# Patient Record
Sex: Female | Born: 2000 | Race: White | Hispanic: No | Marital: Single | Smoking: Never smoker
Health system: Southern US, Community
[De-identification: ages and names within clinical notes are randomized; demographics above are authoritative.]

## PROBLEM LIST (undated history)

## (undated) ENCOUNTER — Ambulatory Visit: Payer: PRIVATE HEALTH INSURANCE

## (undated) ENCOUNTER — Telehealth

## (undated) ENCOUNTER — Encounter: Attending: Nurse Practitioner | Primary: Nurse Practitioner

## (undated) ENCOUNTER — Telehealth: Attending: Pediatrics | Primary: Pediatrics

## (undated) ENCOUNTER — Ambulatory Visit: Payer: Medicaid (Managed Care)

## (undated) ENCOUNTER — Encounter

## (undated) ENCOUNTER — Telehealth
Attending: Student in an Organized Health Care Education/Training Program | Primary: Student in an Organized Health Care Education/Training Program

## (undated) ENCOUNTER — Ambulatory Visit

## (undated) ENCOUNTER — Non-Acute Institutional Stay: Payer: PRIVATE HEALTH INSURANCE

## (undated) ENCOUNTER — Ambulatory Visit
Payer: Medicaid (Managed Care) | Attending: Student in an Organized Health Care Education/Training Program | Primary: Student in an Organized Health Care Education/Training Program

## (undated) ENCOUNTER — Encounter
Attending: Student in an Organized Health Care Education/Training Program | Primary: Student in an Organized Health Care Education/Training Program

## (undated) ENCOUNTER — Encounter: Attending: Gastroenterology | Primary: Gastroenterology

## (undated) ENCOUNTER — Other Ambulatory Visit

## (undated) ENCOUNTER — Ambulatory Visit: Payer: PRIVATE HEALTH INSURANCE | Attending: Otolaryngology | Primary: Otolaryngology

## (undated) ENCOUNTER — Telehealth: Attending: Pediatric Gastroenterology | Primary: Pediatric Gastroenterology

## (undated) ENCOUNTER — Encounter: Payer: PRIVATE HEALTH INSURANCE | Attending: Pediatrics | Primary: Pediatrics

## (undated) ENCOUNTER — Encounter: Attending: Pediatric Gastroenterology | Primary: Pediatric Gastroenterology

## (undated) ENCOUNTER — Encounter: Attending: Rheumatology | Primary: Rheumatology

## (undated) ENCOUNTER — Inpatient Hospital Stay: Payer: Medicaid (Managed Care)

## (undated) ENCOUNTER — Inpatient Hospital Stay

## (undated) ENCOUNTER — Ambulatory Visit: Payer: PRIVATE HEALTH INSURANCE | Attending: Nurse Practitioner | Primary: Nurse Practitioner

## (undated) ENCOUNTER — Ambulatory Visit: Payer: PRIVATE HEALTH INSURANCE | Attending: Internal Medicine | Primary: Internal Medicine

## (undated) ENCOUNTER — Encounter: Attending: Pediatrics | Primary: Pediatrics

## (undated) ENCOUNTER — Telehealth: Attending: Registered" | Primary: Registered"

## (undated) ENCOUNTER — Ambulatory Visit: Payer: Medicaid (Managed Care) | Attending: Social Worker | Primary: Social Worker

## (undated) ENCOUNTER — Ambulatory Visit: Payer: Medicaid (Managed Care) | Attending: Nurse Practitioner | Primary: Nurse Practitioner

## (undated) ENCOUNTER — Ambulatory Visit: Attending: Pediatrics | Primary: Pediatrics

## (undated) ENCOUNTER — Encounter: Payer: PRIVATE HEALTH INSURANCE | Attending: Clinical | Primary: Clinical

## (undated) ENCOUNTER — Other Ambulatory Visit: Attending: Social Worker | Primary: Social Worker

## (undated) DIAGNOSIS — K5010 Crohn's disease of large intestine without complications: Secondary | ICD-10-CM

## (undated) DIAGNOSIS — B2790 Infectious mononucleosis, unspecified without complication: Secondary | ICD-10-CM

## (undated) MED ORDER — INFLIXIMAB 100 MG INTRAVENOUS SOLUTION: INTRAVENOUS | 0 days

---
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
## 2021-01-06 ENCOUNTER — Emergency Department
Admit: 2021-01-06 | Discharge: 2021-01-06 | Disposition: A | Discharge status: Home / Self Care | Payer: PRIVATE HEALTH INSURANCE | Attending: Pediatrics

## 2021-01-06 ENCOUNTER — Ambulatory Visit
Admit: 2021-01-06 | Discharge: 2021-01-06 | Disposition: A | Discharge status: Home / Self Care | Payer: PRIVATE HEALTH INSURANCE | Attending: Pediatrics

## 2021-01-06 DIAGNOSIS — H109 Unspecified conjunctivitis: Principal | ICD-10-CM

## 2021-01-06 DIAGNOSIS — J029 Acute pharyngitis, unspecified: Principal | ICD-10-CM

## 2021-01-06 DIAGNOSIS — R509 Fever, unspecified: Principal | ICD-10-CM

## 2021-01-06 DIAGNOSIS — B340 Adenovirus infection, unspecified: Principal | ICD-10-CM

## 2021-01-06 MED ORDER — ONDANSETRON 4 MG DISINTEGRATING TABLET
ORAL_TABLET | Freq: Three times a day (TID) | ORAL | 0 refills | 3 days | Status: CP | PRN
Start: 2021-01-06 — End: 2021-01-09

## 2021-01-27 ENCOUNTER — Ambulatory Visit: Admit: 2021-01-27 | Discharge: 2021-01-28 | Payer: PRIVATE HEALTH INSURANCE

## 2021-01-27 DIAGNOSIS — K5080 Crohn's disease of both small and large intestine without complications: Principal | ICD-10-CM

## 2021-03-24 ENCOUNTER — Ambulatory Visit: Admit: 2021-03-24 | Discharge: 2021-03-24 | Payer: PRIVATE HEALTH INSURANCE

## 2021-03-24 DIAGNOSIS — K5080 Crohn's disease of both small and large intestine without complications: Principal | ICD-10-CM

## 2021-05-08 DIAGNOSIS — K5090 Crohn's disease, unspecified, without complications: Principal | ICD-10-CM

## 2021-05-19 ENCOUNTER — Ambulatory Visit: Admit: 2021-05-19 | Discharge: 2021-05-20 | Payer: PRIVATE HEALTH INSURANCE

## 2021-05-19 DIAGNOSIS — K5080 Crohn's disease of both small and large intestine without complications: Principal | ICD-10-CM

## 2021-07-14 ENCOUNTER — Ambulatory Visit: Admit: 2021-07-14 | Discharge: 2021-07-15 | Payer: PRIVATE HEALTH INSURANCE

## 2021-07-14 DIAGNOSIS — K5080 Crohn's disease of both small and large intestine without complications: Principal | ICD-10-CM

## 2021-09-08 ENCOUNTER — Ambulatory Visit: Admit: 2021-09-08 | Discharge: 2021-09-08 | Payer: PRIVATE HEALTH INSURANCE

## 2021-09-08 DIAGNOSIS — K5090 Crohn's disease, unspecified, without complications: Principal | ICD-10-CM

## 2021-09-08 DIAGNOSIS — K5080 Crohn's disease of both small and large intestine without complications: Principal | ICD-10-CM

## 2021-09-08 DIAGNOSIS — Z9189 Other specified personal risk factors, not elsewhere classified: Principal | ICD-10-CM

## 2021-09-08 DIAGNOSIS — Z79899 Other long term (current) drug therapy: Principal | ICD-10-CM

## 2021-09-22 IMAGING — CT CT NECK W/ CM
3 of 4 series · 13 of 33 positions shown, 16 images · IV contrast (omnipaque)
Comparison: No pertinent prior exams available for comparison.

CLINICAL DATA: Epiglottitis or tonsillitis suspected. Additional
provided: Patient reports sore throat for 2 days, pain with
swallowing and speaking. Mononucleosis in [REDACTED]. Fevers at home.

EXAM:
CT NECK WITH CONTRAST
TECHNIQUE: Multidetector CT imaging of the neck was performed using the
standard protocol following the bolus administration of intravenous
contrast.
CONTRAST:  75mL OMNIPAQUE IOHEXOL 300 MG/ML  SOLN

[Series 5: sag neck · sagittal · 0.47mm/px · 5 of 119 slices shown, 6 images]
[im 40/119  bone]
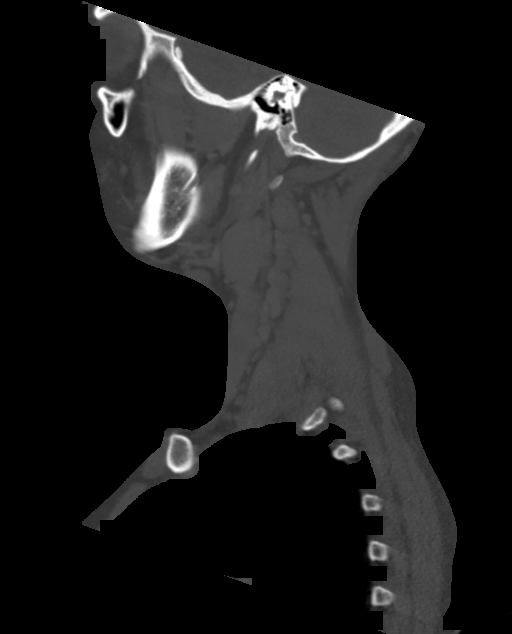
[im 50/119  bone]
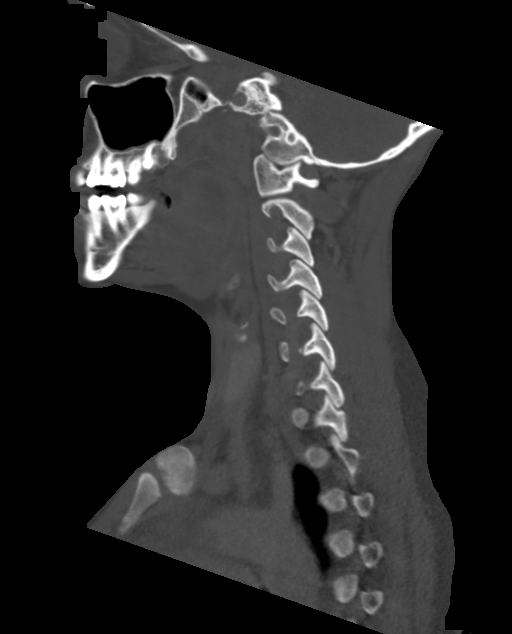
[im 60/119  soft-tissue]
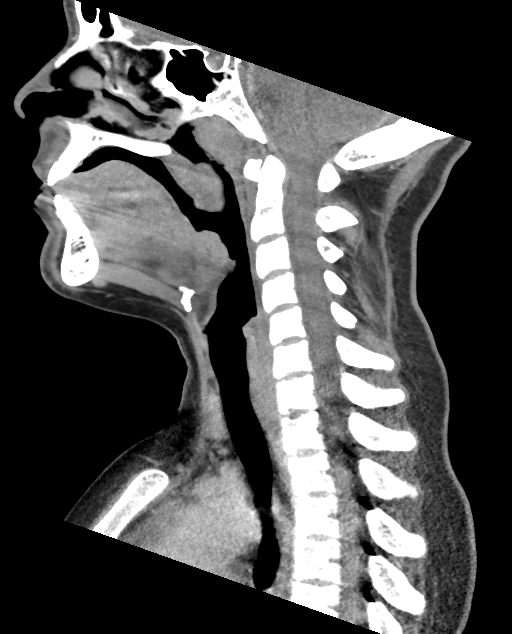
[im 60/119  bone]
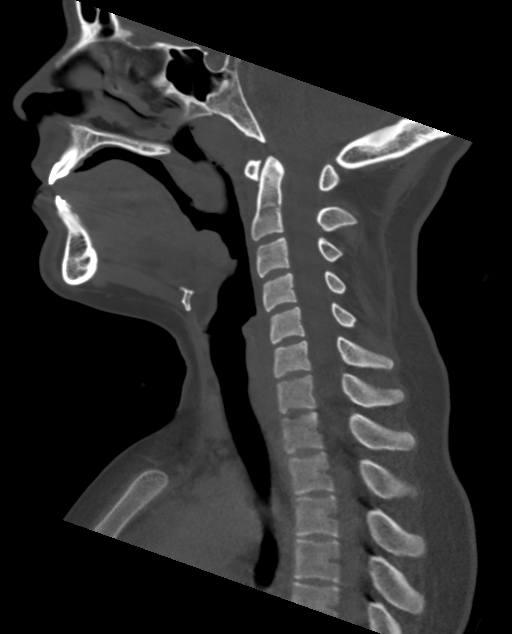
[im 69/119  bone]
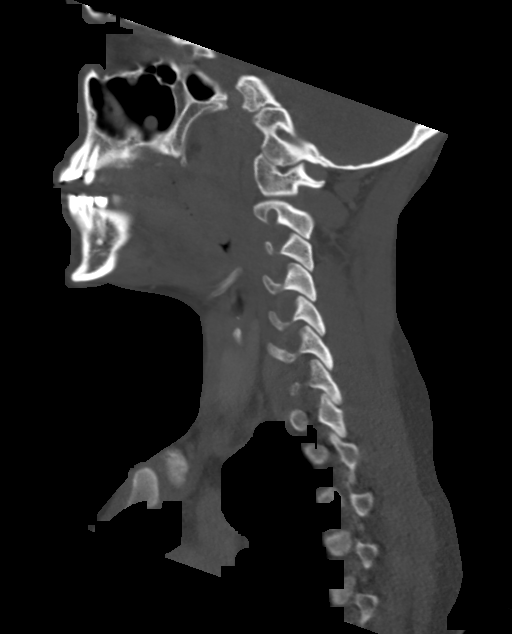
[im 79/119  bone]
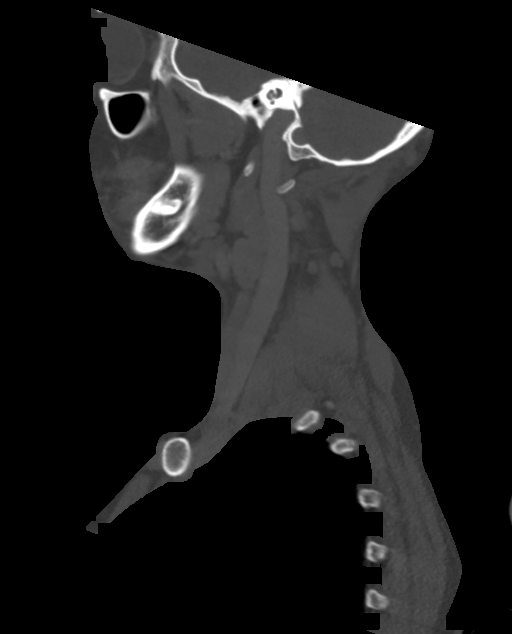

[Series 6: cor neck · coronal · 0.46mm/px · 3 of 113 slices shown]
[im 34/113  bone]
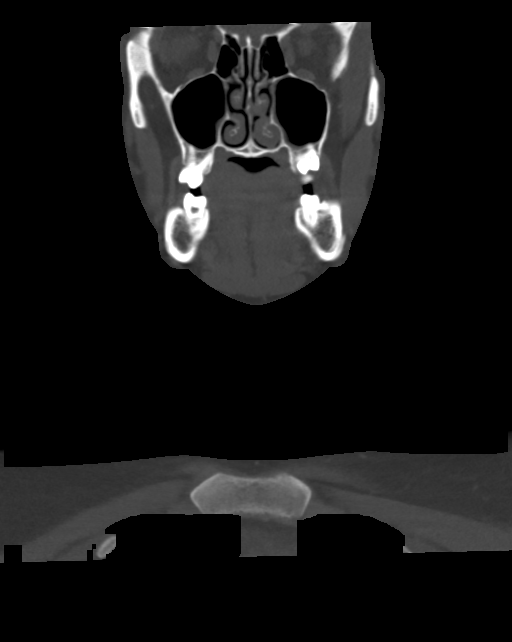
[im 49/113  bone]
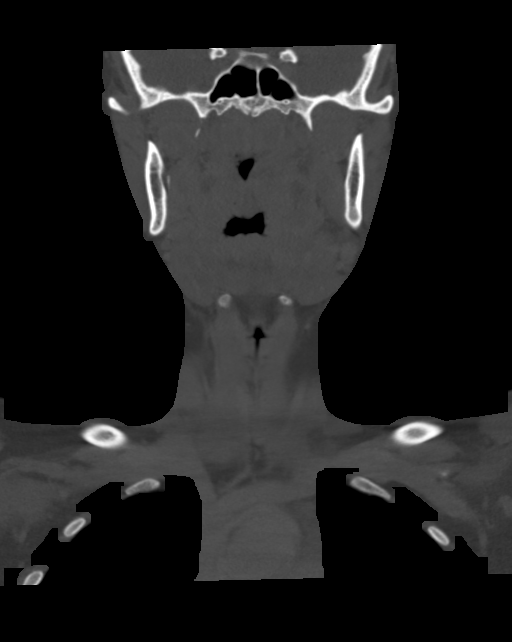
[im 64/113  bone]
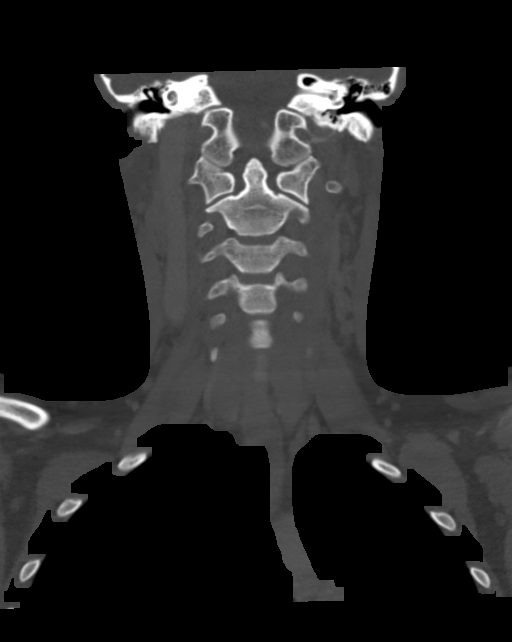

[Series 7: orthogonal ax · axial · 0.45mm/px · z∈[-305,-107]mm · 5 of 148 slices shown, 7 images]
[im 22/148  soft-tissue]
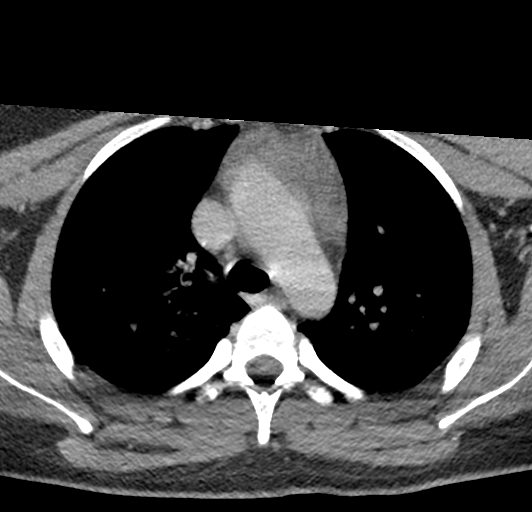
[im 22/148  bone]
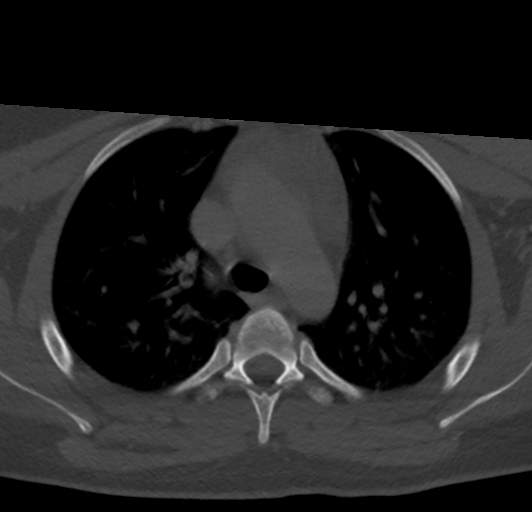
[im 43/148  bone]
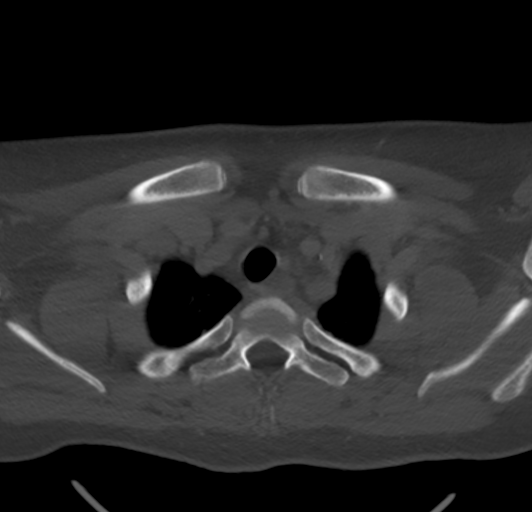
[im 85/148  bone]
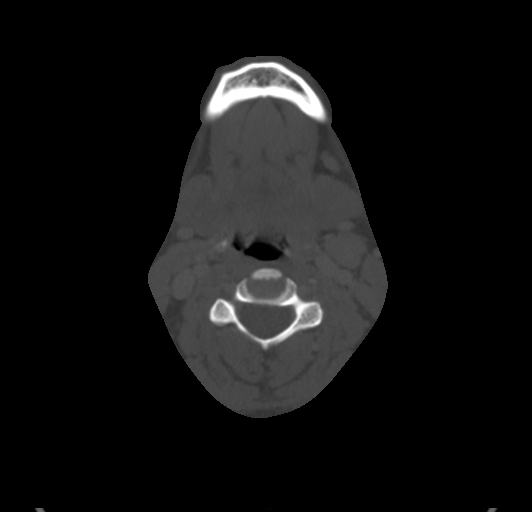
[im 106/148  bone]
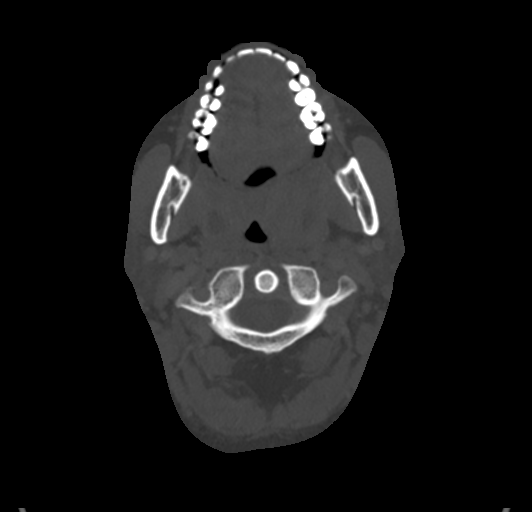
[im 127/148  soft-tissue]
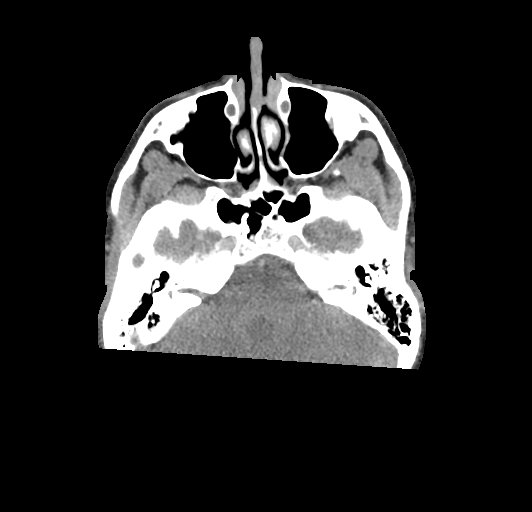
[im 127/148  bone]
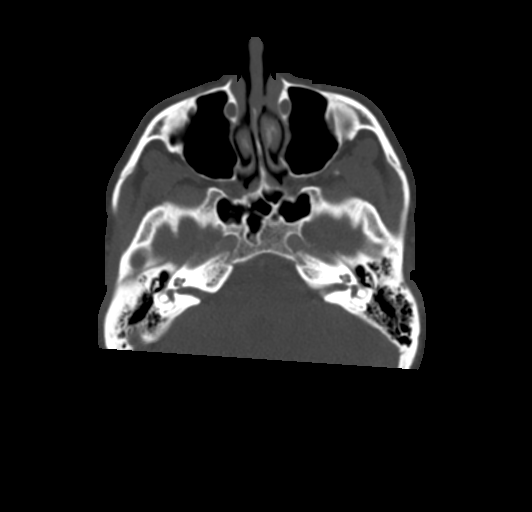

[13 of 33 positions shown; findings below may reference images not displayed]

FINDINGS: Pharynx and larynx: Marked prominence of the bilateral palatine
tonsils. To a lesser extent, there is prominence of the adenoid and
lingual tonsils. There are circumscribed low-density foci within the
bilateral palatine tonsils measuring 1.9 cm on the left and 0.9 cm
on the right likely reflecting peritonsillar abscesses. There is
mild narrowing of the oropharyngeal airway. No retropharyngeal fluid
collection. The epiglottis is unremarkable.

Salivary glands: No inflammation, mass, or stone.

Thyroid: Unremarkable.

Lymph nodes: Bilateral cervical lymphadenopathy, likely reactive
given the provided history. An index left level II lymph node
measures 2.2 cm in transverse dimension.

Vascular: The major vascular structures of the neck are patent. The
left IJ is partially effaced by left upper cervical lymphadenopathy.

Limited intracranial: No acute intracranial abnormality identified.

Visualized orbits: Incompletely imaged. No visible mass or acute
abnormality.

Mastoids and visualized paranasal sinuses: Mild ethmoid and
maxillary sinus mucosal thickening. Small right maxillary sinus
mucous retention cyst.

Skeleton: No acute bony abnormality or aggressive osseous lesion.

Upper chest: No consolidation within the imaged lung apices.
IMPRESSION: Findings compatible with tonsillitis. Probable bilateral
peritonsillar abscesses measuring 1.9 cm on the left and 0.9 cm on
the right. Mild narrowing of the oropharyngeal airway. No
retropharyngeal collection. The epiglottis is unremarkable.

Bilateral cervical lymphadenopathy, likely reactive. Clinical
follow-up is recommended to ensure resolution and exclude
alternative etiologies.

Mild ethmoid and maxillary sinus mucosal thickening. Small right
maxillary sinus mucous retention cyst.

## 2021-10-08 ENCOUNTER — Encounter
Admit: 2021-10-08 | Discharge: 2021-10-08 | Payer: PRIVATE HEALTH INSURANCE | Attending: Anesthesiology | Primary: Anesthesiology

## 2021-10-08 ENCOUNTER — Ambulatory Visit: Admit: 2021-10-08 | Discharge: 2021-10-08 | Payer: PRIVATE HEALTH INSURANCE

## 2021-11-06 ENCOUNTER — Ambulatory Visit: Admit: 2021-11-06 | Discharge: 2021-11-07 | Payer: PRIVATE HEALTH INSURANCE

## 2021-11-06 DIAGNOSIS — K5080 Crohn's disease of both small and large intestine without complications: Principal | ICD-10-CM

## 2022-01-01 ENCOUNTER — Ambulatory Visit: Admit: 2022-01-01 | Discharge: 2022-01-01 | Payer: PRIVATE HEALTH INSURANCE

## 2022-01-01 DIAGNOSIS — K5080 Crohn's disease of both small and large intestine without complications: Principal | ICD-10-CM

## 2022-01-13 ENCOUNTER — Telehealth: Admit: 2022-01-13 | Discharge: 2022-01-14 | Payer: PRIVATE HEALTH INSURANCE

## 2022-01-13 DIAGNOSIS — K5090 Crohn's disease, unspecified, without complications: Principal | ICD-10-CM

## 2022-01-13 DIAGNOSIS — Z9189 Other specified personal risk factors, not elsewhere classified: Principal | ICD-10-CM

## 2022-01-13 DIAGNOSIS — Z79899 Other long term (current) drug therapy: Principal | ICD-10-CM

## 2022-02-26 MED ORDER — ERGOCALCIFEROL (VITAMIN D2) 1,250 MCG (50,000 UNIT) CAPSULE
ORAL_CAPSULE | 1 refills | 0 days | Status: CP
Start: 2022-02-26 — End: ?

## 2022-03-03 ENCOUNTER — Ambulatory Visit: Admit: 2022-03-03 | Discharge: 2022-03-03 | Payer: PRIVATE HEALTH INSURANCE

## 2022-03-03 DIAGNOSIS — K5080 Crohn's disease of both small and large intestine without complications: Principal | ICD-10-CM

## 2022-04-19 ENCOUNTER — Ambulatory Visit
Admit: 2022-04-19 | Discharge: 2022-04-23 | Disposition: A | Discharge status: Home / Self Care | Payer: PRIVATE HEALTH INSURANCE

## 2022-04-19 ENCOUNTER — Encounter
Admit: 2022-04-19 | Discharge: 2022-04-23 | Disposition: A | Discharge status: Home / Self Care | Payer: PRIVATE HEALTH INSURANCE | Attending: Emergency Medicine

## 2022-04-19 ENCOUNTER — Encounter
Admit: 2022-04-19 | Discharge: 2022-04-23 | Payer: PRIVATE HEALTH INSURANCE | Attending: Student in an Organized Health Care Education/Training Program

## 2022-04-19 DIAGNOSIS — D696 Thrombocytopenia, unspecified: Secondary | ICD-10-CM | POA: Diagnosis not present

## 2022-04-19 DIAGNOSIS — D6959 Other secondary thrombocytopenia: Secondary | ICD-10-CM | POA: Diagnosis not present

## 2022-04-19 DIAGNOSIS — E611 Iron deficiency: Secondary | ICD-10-CM | POA: Diagnosis not present

## 2022-04-19 DIAGNOSIS — K5080 Crohn's disease of both small and large intestine without complications: Secondary | ICD-10-CM | POA: Diagnosis not present

## 2022-04-19 DIAGNOSIS — K5090 Crohn's disease, unspecified, without complications: Secondary | ICD-10-CM | POA: Diagnosis not present

## 2022-04-19 DIAGNOSIS — R21 Rash and other nonspecific skin eruption: Secondary | ICD-10-CM | POA: Diagnosis not present

## 2022-04-22 DIAGNOSIS — D693 Immune thrombocytopenic purpura: Principal | ICD-10-CM

## 2022-04-22 MED ORDER — NORETHINDRONE ACETATE 5 MG TABLET
ORAL_TABLET | 11 refills | 0 days | Status: CP
Start: 2022-04-22 — End: ?

## 2022-04-23 ENCOUNTER — Ambulatory Visit: Admit: 2022-04-23 | Discharge: 2022-04-24 | Payer: PRIVATE HEALTH INSURANCE

## 2022-04-26 ENCOUNTER — Ambulatory Visit
Admit: 2022-04-26 | Discharge: 2022-04-27 | Payer: PRIVATE HEALTH INSURANCE | Attending: Nurse Practitioner | Primary: Nurse Practitioner

## 2022-04-26 DIAGNOSIS — D693 Immune thrombocytopenic purpura: Principal | ICD-10-CM

## 2022-04-26 MED ORDER — DEXAMETHASONE 4 MG TABLET
ORAL_TABLET | Freq: Every day | ORAL | 0 refills | 4 days | Status: CP
Start: 2022-04-26 — End: 2022-04-30

## 2022-04-27 ENCOUNTER — Ambulatory Visit: Admit: 2022-04-27 | Discharge: 2022-04-28 | Payer: PRIVATE HEALTH INSURANCE

## 2022-04-27 DIAGNOSIS — D693 Immune thrombocytopenic purpura: Principal | ICD-10-CM

## 2022-04-28 ENCOUNTER — Ambulatory Visit: Admit: 2022-04-28 | Discharge: 2022-04-29 | Payer: PRIVATE HEALTH INSURANCE

## 2022-04-28 DIAGNOSIS — D693 Immune thrombocytopenic purpura: Principal | ICD-10-CM

## 2022-04-28 DIAGNOSIS — K5080 Crohn's disease of both small and large intestine without complications: Principal | ICD-10-CM

## 2022-04-28 MED ORDER — OMEPRAZOLE 20 MG CAPSULE,DELAYED RELEASE
ORAL_CAPSULE | Freq: Every day | ORAL | 0 refills | 30 days | Status: CP
Start: 2022-04-28 — End: 2022-05-28

## 2022-04-29 ENCOUNTER — Telehealth: Admit: 2022-04-29 | Discharge: 2022-04-30 | Payer: PRIVATE HEALTH INSURANCE

## 2022-04-29 DIAGNOSIS — K5090 Crohn's disease, unspecified, without complications: Principal | ICD-10-CM

## 2022-04-29 DIAGNOSIS — D693 Immune thrombocytopenic purpura: Principal | ICD-10-CM

## 2022-05-04 ENCOUNTER — Ambulatory Visit: Admit: 2022-05-04 | Discharge: 2022-05-04 | Payer: PRIVATE HEALTH INSURANCE

## 2022-05-04 DIAGNOSIS — D693 Immune thrombocytopenic purpura: Principal | ICD-10-CM

## 2022-05-04 DIAGNOSIS — K5080 Crohn's disease of both small and large intestine without complications: Principal | ICD-10-CM

## 2022-05-11 ENCOUNTER — Ambulatory Visit: Admit: 2022-05-11 | Discharge: 2022-05-12 | Payer: PRIVATE HEALTH INSURANCE

## 2022-05-12 DIAGNOSIS — D693 Immune thrombocytopenic purpura: Principal | ICD-10-CM

## 2022-05-12 DIAGNOSIS — K5080 Crohn's disease of both small and large intestine without complications: Principal | ICD-10-CM

## 2022-05-18 ENCOUNTER — Ambulatory Visit: Admit: 2022-05-18 | Discharge: 2022-05-18 | Payer: PRIVATE HEALTH INSURANCE

## 2022-05-18 DIAGNOSIS — D693 Immune thrombocytopenic purpura: Principal | ICD-10-CM

## 2022-05-18 DIAGNOSIS — K5080 Crohn's disease of both small and large intestine without complications: Principal | ICD-10-CM

## 2022-05-25 DIAGNOSIS — D693 Immune thrombocytopenic purpura: Principal | ICD-10-CM

## 2022-05-26 ENCOUNTER — Ambulatory Visit: Admit: 2022-05-26 | Discharge: 2022-05-27 | Payer: PRIVATE HEALTH INSURANCE

## 2022-05-26 DIAGNOSIS — K5080 Crohn's disease of both small and large intestine without complications: Principal | ICD-10-CM

## 2022-05-28 ENCOUNTER — Ambulatory Visit: Admit: 2022-05-28 | Discharge: 2022-05-28 | Payer: PRIVATE HEALTH INSURANCE

## 2022-05-28 DIAGNOSIS — D693 Immune thrombocytopenic purpura: Principal | ICD-10-CM

## 2022-06-04 ENCOUNTER — Ambulatory Visit: Admit: 2022-06-04 | Discharge: 2022-06-05 | Payer: PRIVATE HEALTH INSURANCE

## 2022-06-08 ENCOUNTER — Ambulatory Visit: Admit: 2022-06-08 | Discharge: 2022-06-08 | Payer: PRIVATE HEALTH INSURANCE

## 2022-06-08 DIAGNOSIS — D693 Immune thrombocytopenic purpura: Principal | ICD-10-CM

## 2022-06-10 ENCOUNTER — Ambulatory Visit
Admit: 2022-06-10 | Discharge: 2022-06-11 | Payer: PRIVATE HEALTH INSURANCE | Attending: Nurse Practitioner | Primary: Nurse Practitioner

## 2022-06-10 DIAGNOSIS — R5383 Other fatigue: Principal | ICD-10-CM

## 2022-06-10 DIAGNOSIS — E611 Iron deficiency: Principal | ICD-10-CM

## 2022-06-10 DIAGNOSIS — D693 Immune thrombocytopenic purpura: Principal | ICD-10-CM

## 2022-06-15 ENCOUNTER — Ambulatory Visit: Admit: 2022-06-15 | Discharge: 2022-06-15 | Payer: PRIVATE HEALTH INSURANCE

## 2022-06-15 DIAGNOSIS — D693 Immune thrombocytopenic purpura: Principal | ICD-10-CM

## 2022-06-22 ENCOUNTER — Ambulatory Visit: Admit: 2022-06-22 | Discharge: 2022-06-23 | Payer: PRIVATE HEALTH INSURANCE

## 2022-06-22 DIAGNOSIS — D693 Immune thrombocytopenic purpura: Principal | ICD-10-CM

## 2022-06-29 ENCOUNTER — Ambulatory Visit: Admit: 2022-06-29 | Discharge: 2022-06-30 | Payer: PRIVATE HEALTH INSURANCE

## 2022-06-29 DIAGNOSIS — D693 Immune thrombocytopenic purpura: Principal | ICD-10-CM

## 2022-07-05 DIAGNOSIS — D693 Immune thrombocytopenic purpura: Principal | ICD-10-CM

## 2022-07-06 ENCOUNTER — Ambulatory Visit: Admit: 2022-07-06 | Payer: PRIVATE HEALTH INSURANCE

## 2022-07-12 ENCOUNTER — Ambulatory Visit: Admit: 2022-07-12 | Discharge: 2022-07-13 | Payer: PRIVATE HEALTH INSURANCE

## 2022-07-12 DIAGNOSIS — D693 Immune thrombocytopenic purpura: Principal | ICD-10-CM

## 2022-07-12 DIAGNOSIS — K5080 Crohn's disease of both small and large intestine without complications: Principal | ICD-10-CM

## 2022-07-19 DIAGNOSIS — D693 Immune thrombocytopenic purpura: Principal | ICD-10-CM

## 2022-07-20 ENCOUNTER — Ambulatory Visit: Admit: 2022-07-20 | Discharge: 2022-07-21 | Payer: PRIVATE HEALTH INSURANCE

## 2022-07-20 DIAGNOSIS — D693 Immune thrombocytopenic purpura: Principal | ICD-10-CM

## 2022-07-21 ENCOUNTER — Ambulatory Visit: Admit: 2022-07-21 | Discharge: 2022-07-22 | Payer: PRIVATE HEALTH INSURANCE

## 2022-07-26 DIAGNOSIS — D693 Immune thrombocytopenic purpura: Principal | ICD-10-CM

## 2022-07-27 ENCOUNTER — Ambulatory Visit: Admit: 2022-07-27 | Discharge: 2022-07-28 | Payer: PRIVATE HEALTH INSURANCE

## 2022-07-27 DIAGNOSIS — D693 Immune thrombocytopenic purpura: Principal | ICD-10-CM

## 2022-08-04 ENCOUNTER — Ambulatory Visit: Admit: 2022-08-04 | Discharge: 2022-08-04 | Payer: PRIVATE HEALTH INSURANCE

## 2022-08-04 DIAGNOSIS — D693 Immune thrombocytopenic purpura: Principal | ICD-10-CM

## 2022-08-09 ENCOUNTER — Ambulatory Visit: Admit: 2022-08-09 | Discharge: 2022-08-09 | Payer: PRIVATE HEALTH INSURANCE

## 2022-08-09 ENCOUNTER — Ambulatory Visit: Admit: 2022-08-09 | Discharge: 2022-08-10 | Payer: PRIVATE HEALTH INSURANCE

## 2022-08-09 DIAGNOSIS — D693 Immune thrombocytopenic purpura: Principal | ICD-10-CM

## 2022-08-16 ENCOUNTER — Ambulatory Visit: Admit: 2022-08-16 | Payer: PRIVATE HEALTH INSURANCE

## 2022-08-16 DIAGNOSIS — D693 Immune thrombocytopenic purpura: Principal | ICD-10-CM

## 2022-08-23 DIAGNOSIS — D693 Immune thrombocytopenic purpura: Principal | ICD-10-CM

## 2022-08-30 DIAGNOSIS — D693 Immune thrombocytopenic purpura: Principal | ICD-10-CM

## 2022-09-06 DIAGNOSIS — D693 Immune thrombocytopenic purpura: Principal | ICD-10-CM

## 2022-09-13 DIAGNOSIS — D693 Immune thrombocytopenic purpura: Principal | ICD-10-CM

## 2022-09-15 ENCOUNTER — Ambulatory Visit: Admit: 2022-09-15 | Discharge: 2022-09-16 | Payer: PRIVATE HEALTH INSURANCE

## 2022-09-15 DIAGNOSIS — D693 Immune thrombocytopenic purpura: Principal | ICD-10-CM

## 2022-09-15 DIAGNOSIS — K5080 Crohn's disease of both small and large intestine without complications: Principal | ICD-10-CM

## 2022-09-20 DIAGNOSIS — D693 Immune thrombocytopenic purpura: Principal | ICD-10-CM

## 2022-09-21 ENCOUNTER — Ambulatory Visit
Admit: 2022-09-21 | Discharge: 2022-09-22 | Payer: PRIVATE HEALTH INSURANCE | Attending: Nurse Practitioner | Primary: Nurse Practitioner

## 2022-09-21 DIAGNOSIS — D693 Immune thrombocytopenic purpura: Principal | ICD-10-CM

## 2022-09-21 DIAGNOSIS — E611 Iron deficiency: Principal | ICD-10-CM

## 2022-09-22 ENCOUNTER — Telehealth: Admit: 2022-09-22 | Discharge: 2022-09-23 | Payer: PRIVATE HEALTH INSURANCE

## 2022-09-22 DIAGNOSIS — K5090 Crohn's disease, unspecified, without complications: Principal | ICD-10-CM

## 2022-09-22 DIAGNOSIS — Z79899 Other long term (current) drug therapy: Principal | ICD-10-CM

## 2022-09-22 MED ORDER — INFLIXIMAB-DYYB 120 MG/ML SUBCUTANEOUS PEN KIT
PACK | SUBCUTANEOUS | 3 refills | 0.00000 days | Status: CP
Start: 2022-09-22 — End: 2022-09-22
  Filled 2022-10-27: qty 3, 84d supply, fill #0

## 2022-09-23 DIAGNOSIS — K5090 Crohn's disease, unspecified, without complications: Principal | ICD-10-CM

## 2022-09-27 DIAGNOSIS — D693 Immune thrombocytopenic purpura: Principal | ICD-10-CM

## 2022-10-04 DIAGNOSIS — D693 Immune thrombocytopenic purpura: Principal | ICD-10-CM

## 2022-10-04 NOTE — Unmapped (Signed)
Zymfentra subcutaneous approval:     Zymfentra Pen approved for $4 copay for 84ds.   Patient recently completed every 8 week IV infusion on 09/15/2022 and will be due for first subcutaneous pen in place of next scheduled infusion on 11/10/2022.   Patient outreach scheduled for 10/27/2022, which is 2 weeks before first subcutaneous dose is due.  Sent patient Mychart message with link to video and communicated outreach.   IV infusion: 09/15/2022  1st subcutaneous dose due: 11/10/2022    Mirian Capuchin PharmD  [facility]   [phone] opt 4, then opt 2

## 2022-10-04 NOTE — Unmapped (Signed)
[facility] Specialty Medication Onboarding    Specialty Medication: ZYMFENTRA 120 mg/mL Pnkt (inFLIXimab-dyyb)  Prior Authorization: Approved   Financial Assistance: No - copay  <$25  Final Copay/Day Supply: $4 / 84 days    Insurance Restrictions: None     Notes to Pharmacist: Not on zip code defer sheet   Credit Card on File: no    The triage team has completed the benefits investigation and has determined that the patient is able to fill this medication at [facility]. Please contact the patient to complete the onboarding or follow up with the prescribing physician as needed.

## 2022-10-09 LAB — CBC W/ DIFFERENTIAL
BANDED NEUTROPHILS ABSOLUTE COUNT: 0 10*3/uL (ref 0.0–0.1)
BASOPHILS ABSOLUTE COUNT: 0.1 10*3/uL (ref 0.0–0.2)
BASOPHILS RELATIVE PERCENT: 1 %
EOSINOPHILS ABSOLUTE COUNT: 0.7 10*3/uL — ABNORMAL HIGH (ref 0.0–0.4)
EOSINOPHILS RELATIVE PERCENT: 7 %
HEMATOCRIT: 40.9 % (ref 34.0–46.6)
HEMOGLOBIN: 13.8 g/dL (ref 11.1–15.9)
IMMATURE GRANULOCYTES: 0 %
LYMPHOCYTES ABSOLUTE COUNT: 3.1 10*3/uL (ref 0.7–3.1)
LYMPHOCYTES RELATIVE PERCENT: 31 %
MEAN CORPUSCULAR HEMOGLOBIN CONC: 33.7 g/dL (ref 31.5–35.7)
MEAN CORPUSCULAR HEMOGLOBIN: 28.5 pg (ref 26.6–33.0)
MEAN CORPUSCULAR VOLUME: 84 fL (ref 79–97)
MONOCYTES ABSOLUTE COUNT: 0.7 10*3/uL (ref 0.1–0.9)
MONOCYTES RELATIVE PERCENT: 7 %
NEUTROPHILS ABSOLUTE COUNT: 5.5 10*3/uL (ref 1.4–7.0)
NEUTROPHILS RELATIVE PERCENT: 54 %
PLATELET COUNT: 241 10*3/uL (ref 150–450)
RED BLOOD CELL COUNT: 4.85 x10E6/uL (ref 3.77–5.28)
RED CELL DISTRIBUTION WIDTH: 13.6 % (ref 11.7–15.4)
WHITE BLOOD CELL COUNT: 10 10*3/uL (ref 3.4–10.8)

## 2022-10-11 DIAGNOSIS — D693 Immune thrombocytopenic purpura: Principal | ICD-10-CM

## 2022-10-11 NOTE — Unmapped (Signed)
Return Patient Follow Up Note (Health Maintenance Visit):    Service Date : 10/12/2022  Performing Service:PEDIATRICS::GASTROENTEROLOGY   Name: Jennifer Huynh   MRNO: [id number]   Age: 22 y.o.   Sex: female    Primary Care Provider: Pcp, Unknown Per Patient    History provided by: patient    An interpreter was not used during the visit.       Assessment:     Jennifer Huynh is a 21 y.o. female is seen in the Pediatric GI clinic for follow up regarding likely Crohn's disease with moderate to severe right-sided colitis, diagnosed on 05/13/16, and eosinophilic esophagitis.  1. Disease activity inactive.   2. Treatment adherence satisfactory.   3. Nutrition status satisfactory.   4. Growth status satisfactory.   5. 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   6.  Needs adult PCP.   7.  Needs several immunizations.   8.  Eye exam UTD, needs full body skin exam.   9.   Mood and anxiety issues identified on screening today, no SI.   10.   Did well on transition to adult care assessment but is not adherent with daily medications.     Plan:   Start Inflectra 120 mg subcutaneous every 2 weeks per Dr. Jacqlyn Krauss.   2.    Recommend [facility] at [facility] for your primary care provider. This is the closest to your house. Located at [address] [place], [place] [address]. Phone number is: [phone]. It is essential to establish primary care asap. I have asked our social worker to help you with this.   3. After you find PCP, you will then need 2 vaccines, PCV-20 and Hep B booster. You will then need your Hep B immunity checked. You may need up to 3 Hep B boosters. We can send the PCP a letter regarding what is needed once you have established care. I also recommend a flu vaccine in the fall.   3. You should be taking daily vitamin D supplement of 2000 IUs. Take one regular strength Tums with it for extra calcium.   4. Recommend yearly skin surveillance. Make appointment with your local dermatologist.   5. Recommend yearly eye exam as you have been doing.   6. Your nurse, Mardella Layman will send you an updated 504 plan that you can upload to the disability or accessibility site at [facility].   8. Try to continue gym workouts as much as possible. This will help your anxiety and your weight.   9.   Once you have established with a PCP, recommend getting mental health provider information from them.   10. Recommend contraception on your part. There are many options besides OCPs   11. You are doing well with self-management (contacting Dr. Jacqlyn Krauss and making your own appointments), education surrounding disease and transition to adult care. Need to be adherent to daily medications such as vitamin D as discussed.   12. Return to Dr. Jacqlyn Krauss as scheduled, likely for one more visit.     Jennifer Huynh verbalized full understanding of this POC and is fully agreeable with it. She will let us know of any signs or symptoms that develop.       Subjective:      HPI:   Jennifer Huynh had a telemedicine visit. She was last seen by her primary GI provider, Dr. Marcello Fennel on 09/22/22. Her last health maintenance visit was 09/08/21.  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 She was treated with IVIG twice and 4 days of dexamethasone, as well as 2 platelet infusions. She was discharged on oral dexamethasone 10 mg daily. She was on weekly Nplate which she completed 07/20/22. Her platelet count is now normal.   She does not have a primary care provider and has not had one in at least a year since last year's visit.  Her maintenance medication is infliximab (Remicade) 20 mg/kg every 8 weeks. 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 Jacqlyn Krauss has changed her maintenance medication to infliximab (Inflecta) 120 mg subcutaneous every 2 weeks. She thinks it has been approved by insurance and will start around 11/08/22 when she would be due for her next infusion. 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   She states she is feeling well. She has no complaints of abdominal pain. She has 2 formed stools/day. She has no diarrhea or blood in her stool. She has no nocturnal stooling.   She has no mouth ulcers, no eye pain, no skin issues or joint pain.   Her menstrual periods are regular, monthly, last several days, heavy and painful but tolerable on day 1.   She has a good appetite. She says she has lost about 11-12 lbs in the past month. She has been watching what she eats, not eating out as much and exercising regularly. Last BMI was 33.  She has a good amount of energy and sleeps ok once she gets to sleep.   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 She does not have a 504 plan but would like one.   Her vitamin D was 19.7 on 06/10/22. She does not take a supplement.   Therapeutic drug monitoring  07/12/22 was 14.   Last EGD/colonoscopy was 10/08/21. It showed mildly active chronic TI inflammation and 12 eosinophils in proximal esophagus, 18 in distal esophagus. She has no esophageal symptoms and does not take a PPI.    She needs some vaccines. She has not had PCV-13 or PPSV-23.   Her Hep B surface Ab was nonreactive 04/19/22.   She has had 3 HPV vaccines.   She had a flu shot 11/06/21.   She/he has had 2 COVID-19 vaccines. Last was 05/10/20.   She had a Tdap on 10/07/22 which doesn't appear on her immunization record yet.   She has had 2 MMR and 2 Varicella vaccines.   Quantiferon Gold TB was negative 11/06/21 .  She has not had a skin surveillance exam.   She had an eye exam 2 weeks ago which was normal.   PHQ-9/GAD-7 scores today were 9/16 respectively. No SI. She was on Celexa at one time but was not adherent to taking it daily. She says she knows that anxiety is more of a problem than mood and would like to see someone for live visits. She describes mood problems as somewhat difficult, anxiety problems as very difficult.  TRANSITION to adult care assessment score today was 24/27 or 89%. Did well in most areas except new provider.  She is sexually active with one partner of several years. They use condoms and she tracks her ovulation. She is not on oral contraception.   She does not drink alcohol, does not smoke or vape (quit vaping several years ago) and does not take recreational drugs.   She understands that  the plan will be to transfer her care to adult GI in the next 6 months or so. She would like to [facility] care at [facility] as an adult.     Past medical history reviewed and negative except as documented in HPI.     Past Surgical History:   Procedure Laterality Date    PR COLONOSCOPY W/BIOPSY SINGLE/MULTIPLE N/A 05/13/2016    Procedure: COLONOSCOPY, FLEXIBLE, PROXIMAL TO SPLENIC FLEXURE; WITH BIOPSY, SINGLE OR MULTIPLE;  Surgeon: Arnold Long Mir, MD;  Location: PEDS PROCEDURE ROOM [facility];  Service: Gastroenterology    PR COLONOSCOPY W/BIOPSY SINGLE/MULTIPLE N/A 10/14/2016    Procedure: COLONOSCOPY, FLEXIBLE, PROXIMAL TO SPLENIC FLEXURE; WITH BIOPSY, SINGLE OR MULTIPLE;  Surgeon: Salem Senate, MD;  Location: PEDS PROCEDURE ROOM [facility];  Service: Gastroenterology    PR COLONOSCOPY W/BIOPSY SINGLE/MULTIPLE N/A 10/08/2021    Procedure: COLONOSCOPY, FLEXIBLE, PROXIMAL TO SPLENIC FLEXURE; WITH BIOPSY, SINGLE OR MULTIPLE;  Surgeon: Salem Senate, MD;  Location: PEDS PROCEDURE ROOM [facility];  Service: Gastroenterology    PR UPPER GI ENDOSCOPY,BIOPSY N/A 05/13/2016    Procedure: UGI ENDOSCOPY; WITH BIOPSY, SINGLE OR MULTIPLE;  Surgeon: Arnold Long Mir, MD;  Location: PEDS PROCEDURE ROOM [facility];  Service: Gastroenterology    PR UPPER GI ENDOSCOPY,BIOPSY N/A 10/14/2016    Procedure: UGI ENDOSCOPY; WITH BIOPSY, SINGLE OR MULTIPLE;  Surgeon: Salem Senate, MD;  Location: PEDS PROCEDURE ROOM [facility];  Service: Gastroenterology    PR UPPER GI ENDOSCOPY,BIOPSY N/A 10/08/2021    Procedure: UGI ENDOSCOPY; WITH BIOPSY, SINGLE OR MULTIPLE;  Surgeon: Salem Senate, MD;  Location: PEDS PROCEDURE ROOM [facility];  Service: Gastroenterology         No family history on file.     Social History:  Past social history reviewed and unchanged.     Allergies:  Pistachio nut     Medications:  Current Outpatient Medications   Medication Sig Dispense Refill    fluconazole (DIFLUCAN) 150 MG tablet Take 1 tablet (150 mg total) by mouth Once.      inFLIXimab-dyyb 120 mg/mL PnKt Inject 120 mg under the skin every fourteen (14) days. 8 kit 3     No current facility-administered medications for this visit.       ROS:     All other ROS negative except for what is documented in the HPI           Objective:     PE:   There were no vitals filed for this visit.  Wt Readings from Last 3 Encounters:   09/21/22 98.4 kg (217 lb)   09/15/22 99.1 kg (218 lb 7.6 oz)   08/04/22 (!) 101.1 kg (222 lb 14.4 oz)      BMI: Estimated body mass index is 33.47 kg/m?? as calculated from the following:    Height as of 09/21/22: 171.5 cm (5' 7.52).    Weight as of 09/21/22: 98.4 kg (217 lb).    General: well-appearing, no acute distress  Head: normocephalic, normal hair pattern  Eyes: sclera clear  Mouth: lips pink, moist mucous membranes  Neck: supple, no asymmetry  Respiratory: respirations even and unlabored  Cardiovascular: no cyanosis  Extremities: no visible signs of wasting, moving all extremities well  Skin: no visible rashes, bruising  Neurology: alert    The patient reports they are currently: at home. I spent 45 minutes on the video visit with the patient on the date of service. I spent an additional 30 minutes on pre- and post-visit activities on the date  of service.      The provider was located off site. The patient was physically located in [place] or a state in which I am permitted to provide care. The patient and/or parent/guardian understood that s/he may incur co-pays and cost sharing,and agreed to the telemedicine visit. The visit was reasonable and appropriate under the circumstances given the patient's presentation at the time.     The patient and/or parent/guardian has been advised of the potential risks and limitations of this mode of treatment (including, but not limited to, the absence of in-person examination) and has agreed to be treated using telemedicine. The patient's/patient's family's questions regarding telemedicine have been answered.      If the visit was completed in an ambulatory setting, the patient and/or parent/guardian has also been advised to contact their provider's office for worsening conditions, and seek emergency medical treatment and/or call 911 if the patient deems either necessary.

## 2022-10-12 ENCOUNTER — Telehealth: Admit: 2022-10-12 | Discharge: 2022-10-13 | Payer: PRIVATE HEALTH INSURANCE

## 2022-10-15 NOTE — Unmapped (Addendum)
Start Inflectra 120 mg subcutaneous every 2 weeks per Dr. Jacqlyn Krauss.   2.    Recommend [facility] at [facility] for your primary care provider. This is the closest to your house. Located at [address] [place], [place] [address]. Phone number is: [phone]. It is essential to establish primary care asap. I have asked our social worker to help you with this.   3. After you find PCP, you will then need 2 vaccines, PCV-20 and Hep B booster. You will then need your Hep B immunity checked. You may need up to 3 Hep B boosters. We can send the PCP a letter regarding what is needed once you have established care. I also recommend a flu vaccine in the fall.   3. You should be taking daily vitamin D supplement of 2000 IUs. Take one regular strength Tums with it for extra calcium.   4. Recommend yearly skin surveillance. Make appointment with your local dermatologist.   5. Recommend yearly eye exam as you have been doing.   6. Your nurse, Mardella Layman will send you an updated 504 plan that you can upload to the disability or accessibility site at [facility].   8. Try to continue gym workouts as much as possible. This will help your anxiety and your weight.   9.   Once you have established with a PCP, recommend getting mental health provider information from them.   10. Recommend contraception on your part. There are many options besides OCPs   11. You are doing well with self-management (contacting Dr. Jacqlyn Krauss and making your own appointments), education surrounding disease and transition to adult care. Need to be adherent to daily medications such as vitamin D as discussed.   12. Return to Dr. Jacqlyn Krauss as scheduled, likely for one more visit.

## 2022-10-18 DIAGNOSIS — D693 Immune thrombocytopenic purpura: Principal | ICD-10-CM

## 2022-10-21 MED ORDER — EMPTY CONTAINER
3 refills | 0 days
Start: 2022-10-21 — End: ?

## 2022-10-21 NOTE — Unmapped (Signed)
[facility]   Patient Onboarding/Medication Counseling    Completed every 8 week IV infusion on 09/15/2022 and will be due for first subcutaneous pen in place of next scheduled infusion on 11/10/2022   [facility] MyChart Questionnaire Opt In     Jennifer Huynh is a 22 y.o. female with Crohn's who I am counseling today on initiation of therapy.  I am speaking to the patient.    Was a Nurse, learning disability used for this call? No    Verified patient's date of birth / HIPAA.    Specialty medication(s) to be sent: Inflammatory Disorders: Zymfentra      Non-specialty medications/supplies to be sent: sharps      Medications not needed at this time: n/a         Zymfentra (infliximab-dyyb)    Medication & Administration     Dosage: Inject 120mg  under the skin once every 2 weeks starting     Lab tests required prior to treatment initiation:  CBC: CBC complete and WNL.(Completed: 10/08/2022)  LFTs: LFTs complete and WNL.(Completed: 09/15/2022)  Hepatitis B: Hepatitis B serology studies are complete and non-reactive.(Completed: 11/06/2021)  Tuberculosis: Tuberculosis screening resulted in a non-reactive Quantiferon TB Gold assay. (Completed: 11/06/2021)    Administration: Inject under the skin of the abdomen, upper thigh or outer upper arm. Rotate injection sites.    Injection instructions:  Remove pen from the refrigerator and let stand at room temperature for 30 minutes.   Examine the pen and ensure the following:  The expiration date has not passed.  The liquid inside the pen is clear, colorless to pale brown, and does not contain any visible particles.  You may see air bubbles. This is normal.  Choose your injection site and clean with an alcohol wipe; allow to air dry completely.  Always avoid areas where the skin is tender, bruised, red, scaly or hard, scarred, or within 2 inches of the belly button.  Remove the cap from the pen by pulling it straight off.  Discard the cap. Do not try to reattach the cap or touch the needle cover.  Hold the pen at a 90 degree angle to your clean injection site.  Press the pen firmly against your skin to start the injection. You will hear a loud click.  Continue to hold the pen firmly on your skin throughout the entire injection.  You will hear a second loud click which signals that the injection is almost complete.  Continue to hold the pen firmly on your skin for 5 seconds after the second click.   You will know that the injection is complete when the purple indicator completely fills the window and has stopped moving.  Remove the pen from your skin and discard in a sharps container.       Adherence/Missed dose instructions: Administer a missed dose as soon as you remember and start a new schedule based on that date.     Goals of Therapy     To reduce the symptoms of ulcerative colitis and Crohn's disease.     Side Effects & Monitoring Parameters   Injection site reaction (redness, irritation, inflammation localized to the site of administration)  Signs of a common cold - minor sore throat, runny or stuffy nose, etc.  Headache  Joint pain  Diarrhea  Abdominal pain  Dizziness    The following side effects should be reported to the provider:  Signs of a hypersensitivity reaction (shortness of breath, rash, swelling)  Signs of infection (  fever, chills, sore throat, new or worsening cough etc.)  Signs of liver problems (yellowing of the skin/eyes, darkened urine, light-colored stools, severe stomach pain/vomiting)  Signs of new or worsening heart failure (shortness of breath, swelling of ankles or feet, sudden weight gain)  Signs of nervous system disorders (changes in vision, numbness or tingling, seizures, weakness in arms or legs)  Signs of lupus-like syndrome (persistent chest discomfort or pain, shortness of breath, joint pain, rash on cheeks or arms that worsens in the sun)      Contraindications, Warnings, & Precautions     Have your bloodwork checked as you have been told by your prescriber  Talk with your doctor if you are pregnant, planning to become pregnant, or breastfeeding  Warnings and Precautions:  Infections: there is an increased risk for developing serious infections.    Avoid use of Zymfentra in patients with an active infection.  Hepatitis B virus: reactivation is possible.   Tuberculosis: reactivation or new infections are possible.   Malignancies: an increased risk of malignancies has been observed with similar medications.    Avoid use of Zymfentra in patients with a history of malignancy.  Hepatotoxicity: severe liver reactions have been observed with similar medications.  Consider discontinuing Zymfentra if drug-induced liver injury is suspected.  Congestive heart failure: new or worsening heart failure have been observed with similar medications.  Hematologic reactions: leukopenia, neutropenia, thrombocytopenia, and pancytopenia have been observed with similar medications.  Avoid use of Zymfentra in patients who have ongoing or a history of significant hematologic abnormalities.  Consider discontinuation of Zymfentra in patients who develop significant hematologic abnormalities.  Hypersensitivity reactions: symptoms associated with hypersensitivity reactions have been reported.   Neurologic reactions: various CNS disorders such as seizures, multiple sclerosis, and Guillain-Barre syndrome have been observed with similar medications.  Avoid use of Zymfentra in patients with these neurologic disorders and consider discontinuation if these disorders develop.  Autoimmunity: the formation of autoantibodies and the development of a lupus-like syndrome may occur.  Consider discontinuation of Zymfentra in patients who develop a lupus-like syndrome.     Drug/Food Interactions     Medication list reviewed in Epic. The patient was instructed to inform the care team before taking any new medications or supplements. No drug interactions identified.   Talk with your prescriber or pharmacist before receiving any live vaccinations while taking this medication and after you stop taking it    Storage, Handling Precautions, & Disposal   Store this medication in the refrigerator.  Do not freeze  If needed, you may store at room temperature for up to 14 days  Store in original packaging, protected from light  Do not shake  Dispose of used syringes/pens in a sharps disposal container      Current Medications (including OTC/herbals), Comorbidities and Allergies     Current Outpatient Medications   Medication Sig Dispense Refill    fluconazole (DIFLUCAN) 150 MG tablet Take 1 tablet (150 mg total) by mouth Once.      inFLIXimab-dyyb 120 mg/mL PnKt Inject 120 mg under the skin every fourteen (14) days. 8 kit 3     No current facility-administered medications for this visit.       Allergies   Allergen Reactions    Pistachio Nut Shortness Of Breath       Patient Active Problem List   Diagnosis    Crohn's disease of both small and large intestine without complication (CMS-HCC)    Acute idiopathic thrombocytopenic purpura (CMS-HCC)  Reviewed and up to date in Epic.    Appropriateness of Therapy     Acute infections noted within Epic:  No active infections  Patient reported infection: None    Is the medication and dose appropriate based on diagnosis, medication list, comorbidities, allergies, medical history, patient???s ability to self-administer the medication, and therapeutic goals? Yes    Prescription has been clinically reviewed: Yes      Baseline Quality of Life Assessment      How many days over the past month did your CD  keep you from your normal activities? For example, brushing your teeth or getting up in the morning. 0    Financial Information     Medication Assistance provided: Prior Authorization    Anticipated copay of $4/84ds reviewed with patient. Verified delivery address.    Delivery Information     Scheduled delivery date: 9/3    Expected start date: 9/18      Medication will be delivered via Same Day Courier to the prescription address in [facility].  This shipment will not require a signature.      Explained the services we provide at [facility] and that each month we would call to set up refills.  Stressed importance of returning phone calls so that we could ensure they receive their medications in time each month.  Informed patient that we should be setting up refills 7-10 days prior to when they will run out of medication.  A pharmacist will reach out to perform a clinical assessment periodically.  Informed patient that a welcome packet, containing information about our pharmacy and other support services, a Notice of Privacy Practices, and a drug information handout will be sent.      The patient or caregiver noted above participated in the development of this care plan and knows that they can request review of or adjustments to the care plan at any time.      Patient or caregiver verbalized understanding of the above information as well as how to contact the pharmacy at [phone] option 4 with any questions/concerns.  The pharmacy is open Monday through Friday 8:30am-4:30pm.  A pharmacist is available 24/7 via pager to answer any clinical questions they may have.    Patient Specific Needs     Does the patient have any physical, cognitive, or cultural barriers? No    Does the patient have adequate living arrangements? (i.e. the ability to store and take their medication appropriately) Yes    Did you identify any home environmental safety or security hazards? No    Patient prefers to have medications discussed with  Patient     Is the patient or caregiver able to read and understand education materials at a high school level or above? Yes    Patient's primary language is  English     Is the patient high risk? No    SOCIAL DETERMINANTS OF HEALTH     At the [facility], we have learned that life circumstances - like trouble affording food, housing, utilities, or transportation can affect the health of many of our patients.   That is why we wanted to ask: are you currently experiencing any life circumstances that are negatively impacting your health and/or quality of life? No    Social Determinants of Health     Financial Resource Strain: Low Risk  (04/21/2022)    Overall Financial Resource Strain (CARDIA)     Difficulty of Paying Living Expenses: Not  very hard   Internet Connectivity: Not on file   Food Insecurity: No Food Insecurity (04/21/2022)    Hunger Vital Sign     Worried About Running Out of Food in the Last Year: Never true     Ran Out of Food in the Last Year: Never true   Tobacco Use: Medium Risk (10/12/2022)    Patient History     Smoking Tobacco Use: Former     Smokeless Tobacco Use: Never     Passive Exposure: Not on file   Housing/Utilities: Low Risk  (04/21/2022)    Housing/Utilities     Within the past 12 months, have you ever stayed: outside, in a car, in a tent, in an overnight shelter, or temporarily in someone else's home (i.e. couch-surfing)?: No     Are you worried about losing your housing?: No     Within the past 12 months, have you been unable to get utilities (heat, electricity) when it was really needed?: No   Alcohol Use: Not on file   Transportation Needs: No Transportation Needs (04/21/2022)    PRAPARE - Transportation     Lack of Transportation (Medical): No     Lack of Transportation (Non-Medical): No   Substance Use: Not on file   Health Literacy: Not on file   Physical Activity: Not on file   Interpersonal Safety: Unknown (10/21/2022)    Interpersonal Safety     Unsafe Where You Currently Live: Not on file     Physically Hurt by Anyone: Not on file     Abused by Anyone: Not on file   Stress: Not on file   Intimate Partner Violence: Not on file   Depression: Not at risk (09/16/2022)    Received from [facility]    PHQ-2     Patient Health Questionnaire-2 Score: 0   Recent Concern: Depression - Mild depression (09/16/2022)    Received from [facility]    PHQ-9     Patient Health Questionnaire-9 Score: 7   Social Connections: Unknown (06/29/2022)    Received from [facility], [facility] Short Social Needs Screening - Social Connection     Would you like help with any of the following needs: food, medicine/medical supplies, transportation, loneliness, housing or utilities?: Not on file       Would you be willing to receive help with any of the needs that you have identified today? Not applicable       Teofilo Pod, PharmD  [facility] Specialty Pharmacist

## 2022-10-25 DIAGNOSIS — D693 Immune thrombocytopenic purpura: Principal | ICD-10-CM

## 2022-10-26 NOTE — Unmapped (Signed)
Jennifer Huynh 's ZYMFENTRA shipment will be delayed as a result of insufficient inventory of the drug.     I have spoken with the patient and communicated the delay. We will reschedule the medication for the delivery date that the patient agreed upon.  We have confirmed the delivery date as 9/4, via same day courier.

## 2022-10-27 MED FILL — EMPTY CONTAINER: 120 days supply | Qty: 1 | Fill #0

## 2022-11-01 DIAGNOSIS — D693 Immune thrombocytopenic purpura: Principal | ICD-10-CM

## 2022-11-08 DIAGNOSIS — D693 Immune thrombocytopenic purpura: Principal | ICD-10-CM

## 2022-11-10 LAB — CBC W/ DIFFERENTIAL
BANDED NEUTROPHILS ABSOLUTE COUNT: 0 10*3/uL (ref 0.0–0.1)
BASOPHILS ABSOLUTE COUNT: 0.1 10*3/uL (ref 0.0–0.2)
BASOPHILS RELATIVE PERCENT: 1 %
EOSINOPHILS ABSOLUTE COUNT: 0.8 10*3/uL — ABNORMAL HIGH (ref 0.0–0.4)
EOSINOPHILS RELATIVE PERCENT: 9 %
HEMATOCRIT: 42.1 % (ref 34.0–46.6)
HEMOGLOBIN: 14.1 g/dL (ref 11.1–15.9)
IMMATURE GRANULOCYTES: 0 %
LYMPHOCYTES ABSOLUTE COUNT: 3.7 10*3/uL — ABNORMAL HIGH (ref 0.7–3.1)
LYMPHOCYTES RELATIVE PERCENT: 42 %
MEAN CORPUSCULAR HEMOGLOBIN CONC: 33.5 g/dL (ref 31.5–35.7)
MEAN CORPUSCULAR HEMOGLOBIN: 28.6 pg (ref 26.6–33.0)
MEAN CORPUSCULAR VOLUME: 85 fL (ref 79–97)
MONOCYTES ABSOLUTE COUNT: 0.7 10*3/uL (ref 0.1–0.9)
MONOCYTES RELATIVE PERCENT: 8 %
NEUTROPHILS ABSOLUTE COUNT: 3.5 10*3/uL (ref 1.4–7.0)
NEUTROPHILS RELATIVE PERCENT: 40 %
PLATELET COUNT: 289 10*3/uL (ref 150–450)
RED BLOOD CELL COUNT: 4.93 x10E6/uL (ref 3.77–5.28)
RED CELL DISTRIBUTION WIDTH: 13.1 % (ref 11.7–15.4)
WHITE BLOOD CELL COUNT: 8.8 10*3/uL (ref 3.4–10.8)

## 2022-11-15 DIAGNOSIS — D693 Immune thrombocytopenic purpura: Principal | ICD-10-CM

## 2022-11-22 DIAGNOSIS — D693 Immune thrombocytopenic purpura: Principal | ICD-10-CM

## 2022-11-26 NOTE — Unmapped (Signed)
Faxed verification of authenticity for letter for emotional support animal to [phone] per request.

## 2022-11-29 DIAGNOSIS — D693 Immune thrombocytopenic purpura: Principal | ICD-10-CM

## 2022-12-06 DIAGNOSIS — D693 Immune thrombocytopenic purpura: Principal | ICD-10-CM

## 2022-12-13 DIAGNOSIS — D693 Immune thrombocytopenic purpura: Principal | ICD-10-CM

## 2022-12-20 DIAGNOSIS — D693 Immune thrombocytopenic purpura: Principal | ICD-10-CM

## 2022-12-27 DIAGNOSIS — D693 Immune thrombocytopenic purpura: Principal | ICD-10-CM

## 2022-12-30 LAB — CBC W/ DIFFERENTIAL
BANDED NEUTROPHILS ABSOLUTE COUNT: 0 10*3/uL (ref 0.0–0.1)
BASOPHILS ABSOLUTE COUNT: 0.1 10*3/uL (ref 0.0–0.2)
BASOPHILS RELATIVE PERCENT: 1 %
EOSINOPHILS ABSOLUTE COUNT: 0.7 10*3/uL — ABNORMAL HIGH (ref 0.0–0.4)
EOSINOPHILS RELATIVE PERCENT: 8 %
HEMATOCRIT: 41.2 % (ref 34.0–46.6)
HEMOGLOBIN: 13.6 g/dL (ref 11.1–15.9)
IMMATURE GRANULOCYTES: 0 %
LYMPHOCYTES ABSOLUTE COUNT: 2.9 10*3/uL (ref 0.7–3.1)
LYMPHOCYTES RELATIVE PERCENT: 33 %
MEAN CORPUSCULAR HEMOGLOBIN CONC: 33 g/dL (ref 31.5–35.7)
MEAN CORPUSCULAR HEMOGLOBIN: 29.2 pg (ref 26.6–33.0)
MEAN CORPUSCULAR VOLUME: 88 fL (ref 79–97)
MONOCYTES ABSOLUTE COUNT: 0.8 10*3/uL (ref 0.1–0.9)
MONOCYTES RELATIVE PERCENT: 9 %
NEUTROPHILS ABSOLUTE COUNT: 4.4 10*3/uL (ref 1.4–7.0)
NEUTROPHILS RELATIVE PERCENT: 49 %
PLATELET COUNT: 287 10*3/uL (ref 150–450)
RED BLOOD CELL COUNT: 4.66 x10E6/uL (ref 3.77–5.28)
RED CELL DISTRIBUTION WIDTH: 13.1 % (ref 11.7–15.4)
WHITE BLOOD CELL COUNT: 8.8 10*3/uL (ref 3.4–10.8)

## 2023-01-03 DIAGNOSIS — D693 Immune thrombocytopenic purpura: Principal | ICD-10-CM

## 2023-01-10 DIAGNOSIS — D693 Immune thrombocytopenic purpura: Principal | ICD-10-CM

## 2023-01-10 NOTE — Unmapped (Signed)
[facility] Clinical Assessment & Refill Coordination Note    Jennifer Huynh, DOB: 2000/06/21  Phone: [phone] (work)    All above HIPAA information was verified with patient.     Was a Nurse, learning disability used for this call? No    Specialty Medication(s):   Inflammatory Disorders: Zymfentra     Current Outpatient Medications   Medication Sig Dispense Refill    empty container Misc Use 1 each 3    fluconazole (DIFLUCAN) 150 MG tablet Take 1 tablet (150 mg total) by mouth Once.      inFLIXimab-dyyb 120 mg/mL PnKt Inject 120 mg under the skin every fourteen (14) days. 8 kit 3     No current facility-administered medications for this visit.        Changes to medications: Jennifer Huynh reports no changes at this time.    Allergies   Allergen Reactions    Pistachio Nut Shortness Of Breath       Changes to allergies: No    SPECIALTY MEDICATION ADHERENCE     Zymfentra 120 mg/ml: 2 doses of medicine on hand   Medication Adherence    Patient reported X missed doses in the last month: 0  Specialty Medication: Zymfentra 120mg /mL -1q14d  Patient is on additional specialty medications: No  Patient is on more than two specialty medications: No  Informant: patient  Support network for adherence: family member          Specialty medication(s) dose(s) confirmed: Regimen is correct and unchanged.     Are there any concerns with adherence? No    Adherence counseling provided? Not needed    CLINICAL MANAGEMENT AND INTERVENTION      Clinical Benefit Assessment:    Do you feel the medicine is effective or helping your condition? Yes    Clinical Benefit counseling provided? Not needed    Adverse Effects Assessment:    Are you experiencing any side effects? No    Are you experiencing difficulty administering your medicine? No    Quality of Life Assessment:    Quality of Life    Rheumatology  Oncology  Dermatology  Cystic Fibrosis          How many days over the past month did your CD  keep you from your normal activities? For example, brushing your teeth or getting up in the morning. 0    Have you discussed this with your provider? Not needed    Acute Infection Status:    Acute infections noted within Epic:  No active infections  Patient reported infection: None    Therapy Appropriateness:    Is therapy appropriate based on current medication list, adverse reactions, adherence, clinical benefit and progress toward achieving therapeutic goals? Yes, therapy is appropriate and should be continued     DISEASE/MEDICATION-SPECIFIC INFORMATION      For patients on injectable medications: Patient currently has 2 doses left.  Next injection is scheduled for 11/20.    Chronic Inflammatory Diseases: Have you experienced any flares in the last month? No  Has this been reported to your provider? Not applicable    PATIENT SPECIFIC NEEDS     Does the patient have any physical, cognitive, or cultural barriers? No    Is the patient high risk? No    Did the patient require a clinical intervention? No    Does the patient require physician intervention or other additional services (i.e., nutrition, smoking cessation, social work)? No    SOCIAL DETERMINANTS OF  HEALTH     At the [facility], we have learned that life circumstances - like trouble affording food, housing, utilities, or transportation can affect the health of many of our patients.   That is why we wanted to ask: are you currently experiencing any life circumstances that are negatively impacting your health and/or quality of life? No    Social Drivers of Health     Food Insecurity: No Food Insecurity (04/21/2022)    Hunger Vital Sign     Worried About Running Out of Food in the Last Year: Never true     Ran Out of Food in the Last Year: Never true   Internet Connectivity: Not on file   Housing/Utilities: Low Risk  (04/21/2022)    Housing/Utilities     Within the past 12 months, have you ever stayed: outside, in a car, in a tent, in an overnight shelter, or temporarily in someone else's home (i.e. couch-surfing)?: No     Are you worried about losing your housing?: No     Within the past 12 months, have you been unable to get utilities (heat, electricity) when it was really needed?: No   Tobacco Use: Medium Risk (10/12/2022)    Patient History     Smoking Tobacco Use: Former     Smokeless Tobacco Use: Never     Passive Exposure: Not on file   Transportation Needs: No Transportation Needs (04/21/2022)    PRAPARE - Transportation     Lack of Transportation (Medical): No     Lack of Transportation (Non-Medical): No   Alcohol Use: Not on file   Interpersonal Safety: Not on file   Physical Activity: Not on file   Intimate Partner Violence: Not on file   Stress: Not on file   Substance Use: Not on file (12/28/2022)   Social Connections: Unknown (06/29/2022)    Received from [facility] Short Social Needs Screening - Social Connection     Would you like help with any of the following needs: food, medicine/medical supplies, transportation, loneliness, housing or utilities?: Not on file   Financial Resource Strain: Low Risk  (04/21/2022)    Overall Financial Resource Strain (CARDIA)     Difficulty of Paying Living Expenses: Not very hard   Depression: Not at risk (09/16/2022)    Received from [facility]    PHQ-2     Patient Health Questionnaire-2 Score: 0   Recent Concern: Depression - Mild depression (09/16/2022)    Received from [facility]    PHQ-9     Patient Health Questionnaire-9 Score: 7   Health Literacy: Not on file       Would you be willing to receive help with any of the needs that you have identified today? Not applicable       SHIPPING     Specialty Medication(s) to be Shipped:   Inflammatory Disorders: Zymfentra    Other medication(s) to be shipped: No additional medications requested for fill at this time     Changes to insurance: No    Delivery Scheduled: Yes, Expected medication delivery date: 11/20.     Medication will be delivered via Same Day Courier to the confirmed prescription address in [facility].    The patient will receive a drug information handout for each medication shipped and additional FDA Medication Guides as required.  Verified that patient has previously received a Conservation officer, historic buildings and a Surveyor, mining.  The patient or caregiver noted above participated in the development of this care plan and knows that they can request review of or adjustments to the care plan at any time.      All of the patient's questions and concerns have been addressed.    Teofilo Pod, PharmD   [facility] Specialty Pharmacist

## 2023-01-12 MED FILL — ZYMFENTRA 120 MG/ML SUBCUTANEOUS PEN KIT: SUBCUTANEOUS | 84 days supply | Qty: 3 | Fill #1

## 2023-01-17 DIAGNOSIS — D693 Immune thrombocytopenic purpura: Principal | ICD-10-CM

## 2023-01-24 DIAGNOSIS — D693 Immune thrombocytopenic purpura: Principal | ICD-10-CM

## 2023-01-31 DIAGNOSIS — D693 Immune thrombocytopenic purpura: Principal | ICD-10-CM

## 2023-02-07 DIAGNOSIS — D693 Immune thrombocytopenic purpura: Principal | ICD-10-CM

## 2023-03-24 ENCOUNTER — Ambulatory Visit
Admit: 2023-03-24 | Discharge: 2023-03-25 | Payer: PRIVATE HEALTH INSURANCE | Attending: Nurse Practitioner | Primary: Nurse Practitioner

## 2023-03-24 DIAGNOSIS — D500 Iron deficiency anemia secondary to blood loss (chronic): Principal | ICD-10-CM

## 2023-03-24 DIAGNOSIS — D693 Immune thrombocytopenic purpura: Principal | ICD-10-CM

## 2023-03-24 LAB — CBC W/ AUTO DIFF
BASOPHILS ABSOLUTE COUNT: 0.1 10*9/L (ref 0.0–0.1)
BASOPHILS RELATIVE PERCENT: 1.3 %
EOSINOPHILS ABSOLUTE COUNT: 0.8 10*9/L — ABNORMAL HIGH (ref 0.0–0.5)
EOSINOPHILS RELATIVE PERCENT: 10.1 %
HEMATOCRIT: 40.2 % (ref 34.0–44.0)
HEMOGLOBIN: 14 g/dL (ref 11.3–14.9)
LYMPHOCYTES ABSOLUTE COUNT: 2.6 10*9/L (ref 1.1–3.6)
LYMPHOCYTES RELATIVE PERCENT: 31.9 %
MEAN CORPUSCULAR HEMOGLOBIN CONC: 34.9 g/dL (ref 32.0–36.0)
MEAN CORPUSCULAR HEMOGLOBIN: 28.9 pg (ref 25.9–32.4)
MEAN CORPUSCULAR VOLUME: 82.8 fL (ref 77.6–95.7)
MEAN PLATELET VOLUME: 7.9 fL (ref 6.8–10.7)
MONOCYTES ABSOLUTE COUNT: 0.5 10*9/L (ref 0.3–0.8)
MONOCYTES RELATIVE PERCENT: 6.6 %
NEUTROPHILS ABSOLUTE COUNT: 4 10*9/L (ref 1.8–7.8)
NEUTROPHILS RELATIVE PERCENT: 50.1 %
PLATELET COUNT: 261 10*9/L (ref 150–450)
RED BLOOD CELL COUNT: 4.85 10*12/L (ref 3.95–5.13)
RED CELL DISTRIBUTION WIDTH: 13.3 % (ref 12.2–15.2)
WBC ADJUSTED: 8 10*9/L (ref 3.6–11.2)

## 2023-03-24 LAB — FERRITIN: FERRITIN: 14.7 ng/mL (ref 7.3–270.7)

## 2023-03-24 NOTE — Unmapped (Signed)
Hematology Follow up Note    Primary Care Provider:   Pcp, Unknown Per Patient    Other Consulting Provider:  Salem Senate, MD (GI)    Reason for Visit: ITP, iron deficiency    Assessment/Recommendations:   Jennifer Huynh is a 23 y.o. White female with a PMHx of Crohn's disease (currently on Remicade (infliximab), who we are seeing in follow up     1. Thrombocytopenia, ITP: Diagnosed 03/2022, with platelet count <3. Treated with IVIG, steroids with response. Quickly relapsed in 04/2022. Decadron was repeated and she was started on N-plate. Drug Ab to Infliximab were ruled out. She has NOT received Rituximab. Since 04/2022, platelet counts improved and she was able to wean off N-plate, last dose 07/20/22. Since that time, platelet range 210-410. She has no new bleeding, bruising, petechial rashes, and menstrual periods are relatively normal. Will defer Rituximab for now, as platelet count is normal and she is on infliximab for Crohn's disease. Will reconsider if ITP flares again. The goal of ITP treatment is to keep the platelet count above 30 or so.  The platelet count may need to be raised to a higher level acutely in case of bleeding, trauma, or prior to an invasive procedure or surgery. Platelet count today is 261. No ITP treatment needed at this time. Will continue to check labs Q3M at [facility], and PRN for bleeding episodes. Standing orders placed today.    2. Iron deficiency anemia: Hgb drop from 12.8 on 03/03/22 to nadir of 11.2 on 04/21/22, likely due to blood losses. On chart review, she has a longstanding history of iron deficiency without anemia, since at least 2018. B-12 and folate normal as of 03/2022. She received 1000 mg total dose of IV Ferrlecit during hospitalization in 03/2022. At her previous visit in 08/2022, recommended she start an OTC iron supplement daily (325 mg ferrous sulfate or equivalent), but this was not done. Today Hgb 14, MCV 82.8, ferritin 14.7. No symptoms of anemia today. Again recommend starting PO iron supplement daily to every other day, OR MVI with 18 mg iron daily, whichever is better tolerated.    3. Crohn's disease: Followed by Dr. Jacqlyn Krauss at [facility]. Antibodies to infliximab were negative, so she has resumed it for tx of Crohn's disease. Currently self-injecting Q2W. 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     4. Positive ANA/ENA: Has not seen rheumatology. No sx of lupus RA currently. Will continue to monitor for symptoms and re-refer to rheumatology PRN.       Plan:  - Labs today: CBC, ferritin  - Repeat CBC Q3M and PRN for bleeding  - No need for ITP directed therapy at this time.  - May need to consider Rituximab in the future if ITP recurs  - Start OTC iron supplement vs MVI with 18 mg iron daily as tolerated  - Continue to f/u with GI for management of Crohn's disease  - RTC 12 months      Glendell Docker, NP  [facility] Hematology    __________________________________________________________    History of Present Illness:   Last known normal platelet count was on 341 on 03/03/22. She presented to the ED on 04/18/22 with non-blanching petechial rash on bilateral upper and lower extremities, oral purpura, and self-limited epistaxis, found to have a platelet count <3. IVIG and steroids initiated 2/26. Platelet count  at discharge on 04/22/22 was 34. On 3/1 platelet count had increased to 58. When she followed up in clinic on 04/26/22 petechiae and purpura had resolved, and she no longer had epistaxis. She did start her period on Saturday, but it was not excessively heavy. She started norethindrone in effort to minimize menstrual bleeding.Unfortunately, platelet count 04/26/22 was 10, c/w recurrent ITP. Restarted Decadron 40 mg daily x 4 days and plan for romiplostim (Nplate) injection, 3 mcg/kg/dose 04/27/22.      Ms. Strack has a hx of Crohn's disease and has been on Q8W Infliximab since 2019, last dose on 03/03/22. Infliximab has been reported in the literature to be a cause of ITP. In the literature, timing to ITP was within 14 days of drug administration. While this timing does not fit with her 03/03/22 administration, it is still prudent to attempt to rule out infliximab induced ITP prior to giving another dose. Will plan for Inflix Ab and drug dependent Ab testing next week with Nplate labs.     May need to consider Rituximab infusion in the future as this leads to a remission (either complete or pretty good) in about 40-50% of patients. She has an appt with rheumatology in September to eval for additional autoimmune diseases, as ANA+ during hospitalization. ENA panel processing.     06/10/22: Infliximab Ab have been ruled out since her last visit, and she has resumed this for tx of Crohn's. This is better controlling her Crohn's disease. She is doing well with weekly Nplate injections, without side effect. LMP from 4/11-4/13 - heavy at first, but then improved, didn't last long, wasn't alarming. Denies other bleeding, bruising or petechiae. Fatigue persists. Is not currently taking norethindrone or any sort of iron supplement.     Able to wean off N-plate, last dose 07/20/22. No bleeding, bruising, excessive fatigue since then. Menstrual bleeding has returned to normal.     Interval Hx:  Menstrual bleeding has gotten slightly heavier, changes super tampons Q3-4H during heavy days, not overflowing at night, but does change in the middle of the night. Bleeding is monthly, 4-5 days, 3-4 are heavy. Otherwise no new mucosal bleeding, bruising, petechial rash. Enjoying nursing school.    Visit Diagnoses:  1. Acute idiopathic thrombocytopenic purpura (CMS-HCC)    2. Iron deficiency anemia due to chronic blood loss          Medical History:  Past Medical History:   Diagnosis Date    Crohn disease (CMS-HCC)        Surgical History:  Past Surgical History:   Procedure Laterality Date    PR COLONOSCOPY W/BIOPSY SINGLE/MULTIPLE N/A 05/13/2016    Procedure: COLONOSCOPY, FLEXIBLE, PROXIMAL TO SPLENIC FLEXURE; WITH BIOPSY, SINGLE OR MULTIPLE;  Surgeon: Arnold Long Mir, MD;  Location: PEDS PROCEDURE ROOM [facility];  Service: Gastroenterology    PR COLONOSCOPY W/BIOPSY SINGLE/MULTIPLE N/A 10/14/2016    Procedure: COLONOSCOPY, FLEXIBLE, PROXIMAL TO SPLENIC FLEXURE; WITH BIOPSY, SINGLE OR MULTIPLE;  Surgeon: Salem Senate, MD;  Location: PEDS PROCEDURE ROOM [facility];  Service: Gastroenterology    PR COLONOSCOPY W/BIOPSY SINGLE/MULTIPLE N/A 10/08/2021    Procedure: COLONOSCOPY, FLEXIBLE, PROXIMAL TO SPLENIC FLEXURE; WITH BIOPSY, SINGLE OR MULTIPLE;  Surgeon: Salem Senate, MD;  Location: PEDS PROCEDURE ROOM [facility];  Service: Gastroenterology    PR UPPER GI ENDOSCOPY,BIOPSY N/A 05/13/2016    Procedure: UGI ENDOSCOPY; WITH BIOPSY, SINGLE OR MULTIPLE;  Surgeon: Arnold Long Mir, MD;  Location: PEDS PROCEDURE ROOM [facility];  Service: Gastroenterology    PR UPPER  GI ENDOSCOPY,BIOPSY N/A 10/14/2016    Procedure: UGI ENDOSCOPY; WITH BIOPSY, SINGLE OR MULTIPLE;  Surgeon: Salem Senate, MD;  Location: PEDS PROCEDURE ROOM [facility];  Service: Gastroenterology    PR UPPER GI ENDOSCOPY,BIOPSY N/A 10/08/2021    Procedure: UGI ENDOSCOPY; WITH BIOPSY, SINGLE OR MULTIPLE;  Surgeon: Salem Senate, MD;  Location: PEDS PROCEDURE ROOM [facility];  Service: Gastroenterology       Social History:  Social History     Socioeconomic History    Marital status: Single     Spouse name: None    Number of children: None    Years of education: None    Highest education level: None   Tobacco Use    Smoking status: Former     Types: e-Cigarettes    Smokeless tobacco: Never   Substance and Sexual Activity    Alcohol use: Yes    Drug use: Never     Social Drivers of Psychologist, prison and probation services Strain: Low Risk  (04/21/2022)    Overall Financial Resource Strain (CARDIA) Difficulty of Paying Living Expenses: Not very hard   Food Insecurity: No Food Insecurity (04/21/2022)    Hunger Vital Sign     Worried About Running Out of Food in the Last Year: Never true     Ran Out of Food in the Last Year: Never true   Transportation Needs: No Transportation Needs (04/21/2022)    PRAPARE - Transportation     Lack of Transportation (Medical): No     Lack of Transportation (Non-Medical): No    Received from [facility] Short Social Needs Screening - Social Connection       SHAYLA HEMING currently lives in [place] with her family.  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       Allergies:  Pistachio nut    Medications:   Current Outpatient Medications   Medication Sig Dispense Refill    empty container Misc Use 1 each 3    inFLIXimab-dyyb 120 mg/mL PnKt Inject 120 mg under the skin every fourteen (14) days. 8 kit 3    fluconazole (DIFLUCAN) 150 MG tablet Take 1 tablet (150 mg total) by mouth Once. (Patient not taking: Reported on 03/24/2023)       No current facility-administered medications for this visit.       Review of Systems:  As per HPI, otherwise negative x 12 systems.    Objective :  Vitals:    03/24/23 1401   BP: 122/87   BP Site: L Arm   BP Position: Sitting   Pulse: 100   Temp: 36.3 ??C (97.4 ??F)   TempSrc: Temporal   SpO2: 96%   Weight: (!) 101.2 kg (223 lb)   Height: 171.5 cm (5' 7.52)         Physical Exam:  GEN: Well-appearing female in no acute distress.  HEENT: normocephalic, no conjunctival injection, sclera anicteric. No oral petechiae/purpura  RESP: Breathing unlabored  NEURO: A&Ox3, CNII-XII grossly intact.  PSY: Appropriate affect, reasonable insight and judgment.  Skin: Dry, warm, no rashes or petechiae.     Test Results  Results for orders placed or performed in visit on 03/24/23   Ferritin Result Value Ref Range    Ferritin 14.7 7.3 -  270.7 ng/mL   CBC w/ Differential   Result Value Ref Range    WBC 8.0 3.6 - 11.2 10*9/L    RBC 4.85 3.95 - 5.13 10*12/L    HGB 14.0 11.3 - 14.9 g/dL    HCT 16.1 09.6 - 04.5 %    MCV 82.8 77.6 - 95.7 fL    MCH 28.9 25.9 - 32.4 pg    MCHC 34.9 32.0 - 36.0 g/dL    RDW 40.9 81.1 - 91.4 %    MPV 7.9 6.8 - 10.7 fL    Platelet 261 150 - 450 10*9/L    Neutrophils % 50.1 %    Lymphocytes % 31.9 %    Monocytes % 6.6 %    Eosinophils % 10.1 %    Basophils % 1.3 %    Absolute Neutrophils 4.0 1.8 - 7.8 10*9/L    Absolute Lymphocytes 2.6 1.1 - 3.6 10*9/L    Absolute Monocytes 0.5 0.3 - 0.8 10*9/L    Absolute Eosinophils 0.8 (H) 0.0 - 0.5 10*9/L    Absolute Basophils 0.1 0.0 - 0.1 10*9/L

## 2023-03-24 NOTE — Unmapped (Signed)
If you have worsening bruising or new bleeding through in your nose, gums, urine or bowels, worsening menstrual bleeding, or a petechial rash (purple, flat, non-itchy spots), please have your CBC checked and call our clinic at [phone] or send a MyChart message. If the bleeding is excessive, you have a severe headache, or you have symptoms of anemia (dizziness, chest pain, shortness of breath, difficulty thinking), please go to your closest emergency department.    For now we will plan to check you labs (CBC), every 3 months at [facility]. I will be in touch via MyChart when I see the results. I will see you back in clinic in 12 months.

## 2023-03-31 NOTE — Unmapped (Signed)
[facility] Home Delivery Pharm[facility] Refill Coordination Note    Specialty Medication(s) to be Shipped:   Inflammatory Disorders: Zymfentra    Other medication(s) to be shipped: No additional medications requested for fill at this time     Jennifer Huynh, DOB: [birth year]  Phone: [phone] (work)      All above HIPAA information was verified with patient.     Was a Nurse, learning disability used for this call? No    Completed refill call assessment today to schedule patient's medication shipment from the [facility] and [facility] Delivery Pharmacy  [phone]).  All relevant notes have been reviewed.     Specialty medication(s) and dose(s) confirmed: Regimen is correct and unchanged.   Changes to medications: [name] reports no changes at this time.  Changes to insurance: No  New side effects reported not previously addressed with a pharmacist or physician: None reported  Questions for the pharmacist: No    Confirmed patient received a Conservation officer, historic buildings and a Surveyor, mining with first shipment. The patient will receive a drug information handout for each medication shipped and additional FDA Medication Guides as required.       DISEASE/MEDICATION-SPECIFIC INFORMATION        For patients on injectable medications: Patient currently has 1 doses left.  Next injection is scheduled for 02/20.    SPECIALTY MEDICATION ADHERENCE     Medication Adherence    Patient reported X missed doses in the last month: 0  Specialty Medication: ZYMFENTRA 120 mg/mL Pnkt (inFLIXimab-dyyb)  Patient is on additional specialty medications: No  Patient is on more than two specialty medications: No  Any gaps in refill history greater than 2 weeks in the last 3 months: no  Demonstrates understanding of importance of adherence: yes  Informant: patient  Reliability of informant: reliable  Provider-estimated medication adherence level: good  Patient is at risk for Non-Adherence: No  Reasons for non-adherence: no problems identified  Support network for adherence: family member  Confirmed plan for next specialty medication refill: delivery by pharmacy  Refills needed for supportive medications: not needed          Refill Coordination    Has the Patients' Contact Information Changed: No  Is the Shipping Address Different: No         Were doses missed due to medication being on hold? No    zymfentra 120 mg/ml: 14 days of medicine on hand       REFERRAL TO PHARMACIST     Referral to the pharmacist: Not needed      [facility]     Shipping address confirmed in Epic.       Delivery Scheduled: Yes, Expected medication delivery date: 02/20.     Medication will be delivered via Same Day Courier to the prescription address in Epic WAM.    Antonietta Barcelona   [facility]  Specialty Technician

## 2023-04-14 MED FILL — ZYMFENTRA 120 MG/ML SUBCUTANEOUS PEN KIT: SUBCUTANEOUS | 84 days supply | Qty: 3 | Fill #2

## 2023-04-14 NOTE — Unmapped (Signed)
 Jennifer Huynh 's ZYMFENTRA 120 mg/mL Pnkt (inFLIXimab-dyyb) shipment will be rescheduled as a result of Due to weather     I have spoken with the patient  at [phone]  and communicated the delivery change. We will reschedule the medication for the delivery date that the patient agreed upon.  We have confirmed the delivery date as 04/15/23

## 2023-04-17 NOTE — Unmapped (Signed)
 Assessment and Plan:   Diagnoses and all orders for this visit:    Need for hepatitis B screening test  -     Hepatitis B Surface Antigen  -     Hepatitis B Core Antibody, total  -     Hepatitis B surface antibody    Vitamin D insufficiency  -     Vitamin D 25 Hydroxy (25OH D2 + D3)    Screening for STD (sexually transmitted disease)  -     Hepatitis C Antibody  -     Chlamydia/Gonorrhoeae NAA  -     TRICHOMONAS NAA    Crohn's disease of both small and large intestine without complication (CMS-HCC)  -     Comprehensive Metabolic Panel  -     ferrous sulfate 325 (65 FE) MG tablet; Take 1 tablet (325 mg total) by mouth every other day.    Immunization due    Acute idiopathic thrombocytopenic purpura (CMS-HCC)  -     ferrous sulfate 325 (65 FE) MG tablet; Take 1 tablet (325 mg total) by mouth every other day.    Severe anxiety  -     Ambulatory referral to [facility]; Future  -     Ambulatory referral to Psychiatry; Future    Moderate major depression (CMS-HCC)  -     Ambulatory referral to [facility]; Future  -     Ambulatory referral to Psychiatry; Future    Obesity, Class I, BMI 30-34.9  -     TSH  -     Hemoglobin A1c    Other orders  -     norethindrone (AYGESTIN) 5 mg tablet    ----  23 yo female presents to establish care     (1) Thrombocytopenia, ITP  -Following with hematology, Visit on 03/24/2023:   Assessment/Recommendations:   Jennifer Huynh is a 23 y.o. White female with a PMHx of Crohn's disease (currently on Remicade (infliximab), who we are seeing in follow up      1. Thrombocytopenia, ITP: Diagnosed 03/2022, with platelet count <3. Treated with IVIG, steroids with response. Quickly relapsed in 04/2022. Decadron was repeated and she was started on N-plate. Drug Ab to Infliximab were ruled out. She has NOT received Rituximab. Since 04/2022, platelet counts improved and she was able to wean off N-plate, last dose 07/20/22. Since that time, platelet range 210-410. She has no new bleeding, bruising, petechial rashes, and menstrual periods are relatively normal. Will defer Rituximab for now, as platelet count is normal and she is on infliximab for Crohn's disease. Will reconsider if ITP flares again. The goal of ITP treatment is to keep the platelet count above 30 or so.  The platelet count may need to be raised to a higher level acutely in case of bleeding, trauma, or prior to an invasive procedure or surgery. Platelet count today is 261. No ITP treatment needed at this time. Will continue to check labs Q3M at [facility], and PRN for bleeding episodes. Standing orders placed today.  2. Iron deficiency anemia: Hgb drop from 12.8 on 03/03/22 to nadir of 11.2 on 04/21/22, likely due to blood losses. On chart review, she has a longstanding history of iron deficiency without anemia, since at least 2018. B-12 and folate normal as of 03/2022. She received 1000 mg total dose of IV Ferrlecit during hospitalization in 03/2022. At her previous visit in 08/2022, recommended she start an OTC iron supplement daily (325 mg ferrous sulfate or equivalent), but  this was not done. Today Hgb 14, MCV 82.8, ferritin 14.7. No symptoms of anemia today.  Again recommend starting PO iron supplement daily to every other day, OR MVI with 18 mg iron daily, whichever is better tolerated.  3. Crohn's disease: Followed by Dr. Jacqlyn Huynh at [facility]. Antibodies to infliximab were negative, so she has resumed it for tx of Crohn's disease. Currently self-injecting Q2W. 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   4. Positive ANA/ENA: Has not seen rheumatology. No sx of lupus RA currently. Will continue to monitor for symptoms and re-refer to rheumatology PRN.   Plan:  - Labs today: CBC, ferritin  - Repeat CBC Q3M and PRN for bleeding  - No need for ITP directed therapy at this time.  - May need to consider Rituximab in the future if ITP recurs  - Start OTC iron supplement vs MVI with 18 mg iron daily as tolerated  - Continue to f/u with GI for management of Crohn's disease  - RTC 12 month    -Rx for PO Iron supplement sent to pharmacy and encouraged to take every other day or daily.    (2) Crohn's disease  -following with GI, last visit was on 10/12/2022:  Jennifer Huynh is a 23 y.o. female is seen in the Pediatric GI clinic for follow up regarding likely Crohn's disease with moderate to severe right-sided colitis, diagnosed on 05/13/16, and eosinophilic esophagitis.  1. Disease activity inactive.   2. Treatment adherence satisfactory.   3. Nutrition status satisfactory.   4. Growth status satisfactory.   5. 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   6.  Needs adult PCP.   7.  Needs several immunizations.   8.  Eye exam UTD, needs full body skin exam.   9.   Mood and anxiety issues identified on screening today, no SI.   10.   Did well on transition to adult care assessment but is not adherent with daily medications.   Plan:   Start Inflectra 120 mg subcutaneous every 2 weeks per Dr. Jacqlyn Huynh.   2.    Recommend [facility] for your primary care provider. This is the closest to your house. Located at [address] [place], [place] [address]. Phone number is: [phone]. It is essential to establish primary care asap. I have asked our social worker to help you with this.   3. After you find PCP, you will then need 2 vaccines, PCV-20 and Hep B booster. You will then need your Hep B immunity checked. You may need up to 3 Hep B boosters. We can send the PCP a letter regarding what is needed once you have established care. I also recommend a flu vaccine in the fall.   3. You should be taking daily vitamin D supplement of 2000 IUs. Take one regular strength Tums with it for extra calcium.   4. Recommend yearly skin surveillance. Make appointment with your local dermatologist.   5. Recommend yearly eye exam as you have been doing.   6. Your nurse, Mardella Layman will send you an updated 504 plan that you can upload to the disability or accessibility site at [facility]   8. Try to continue gym workouts as much as possible. This will help your anxiety and your weight.   9.   Once you have established with a PCP, recommend getting mental  health provider information from them.   10. Recommend contraception on your part. There are many options besides OCPs   11. You are doing well with self-management (contacting Dr. Jacqlyn Huynh and making your own appointments), education surrounding disease and transition to adult care. Need to be adherent to daily medications such as vitamin D as discussed.   12. Return to Dr. Jacqlyn Huynh as scheduled, likely for one more visit.   -She will reach out to GI regarding switching to an adult provider. We discussed the importance of continued monitoring by GI given her PMH    (3) Severe Anxiety, Moderate Depression  -PHQ-9 score: 11 (moderate)   -GAD-7 score: 17 (severe)   -she does report significant anxiety that is interfering with her school. she denies active/passive SI/HI/hallucinations   -she does report FH of psychiatric conditions (father has bipolar disorder, paternal grandmother had schizophrenia and bipolar disorder and committed suicide)  -She was previously on celexa. We discussed concern regarding SSRI given her thrombocytopenia/ITP  -Initially referred to Psychology for CBT however [facility] refused referral   -Will refer to Psychiatry for evaluation/management but assistance with medication management given her complex medical history    (4) ENA positive  -as noted by hematology on 02/25/2023: 4. Positive ANA/ENA: Has not seen rheumatology. No sx of lupus RA currently. Will continue to monitor for symptoms and re-refer to rheumatology PRN   -Labs from 04/23/2022   -ENA screen elevated 4.20, POSITIVE Anti-SSA AB    -Histone IgGHistone IgG 3.2 Units (per lab report, >2.6 means strong positive)  -Labs from 04/18/2022   -ANA positive, Titer >1:640  -Will offer electronic consult to rheumatology to assess if patient needs to be evaluated in person      HEALTH MAINTENANCE  -Obtaining Hepatitis B titers as recommended by GI  -Additional labs listed above  -HIV negative, Hepatitis C negative (04/18/22)  -Pap smear on 09/16/2022: Comment: NEGATIVE FOR INTRAEPITHELIAL LESION OR MALIGNANCY. FUNGAL ORGANISMS MORPHOLOGICALLY CONSISTENT WITH CANDIDA SPECIES ARE PRESENT.  Repeat pap in 08/2025  -Immunizations  -She is due for: Pneumococcal and Meningococcal. We did review her [facility] as well as previous records. She is agreeable to obtaining these next visit next visit. VIS provided on these immunizations    -up to date on Flu shot 12/10/2022  -up to date on Tdap was 10/07/2022  -agree with annual eye exams and skin evaluation      To return in 4-6 weeks to follow up on anxiety/depression and to provide immunizations (pneumococcal and meningococcal)       I personally spent 60 minutes face-to-face and non-face-to-face in the care of this patient, which includes all pre, intra, and post visit time on the date of service.  All documented time was specific to the E/M visit and does not include any procedures that may have been performed.        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 She is not sure what she wants to go into but maybe psych, she is a CNA as well     Home environment: lives with her boyfriend and her mother, father and younger sister     Drug use: denies  Alcohol use: denies   Tobacco use: denies---she DID vape however    Sexually active? With female partner, boyfriend of  3 years     Denies history of partner violence or domestic abuse     Paternal grandfather passed from brain aneurysm   Father had CHF (thought secondary to alcohol use) but has had VTE  Father had bioplar disorder     Paternal grandmother had shcizophrenia and biopolar disorder and committed suicide     No FH of Cancer    She would like to talk to someone for anxiety and depression      Patient's last menstrual period was 04/14/2023.        ROS:     ROS negative unless otherwise noted in HPI    Vital Signs:     Wt Readings from Last 3 Encounters:   04/19/23 (!) 101.2 kg (223 lb)   03/24/23 (!) 101.2 kg (223 lb)   09/21/22 98.4 kg (217 lb)     Temp Readings from Last 3 Encounters:   04/19/23 36.2 ??C (97.2 ??F)   03/24/23 36.3 ??C (97.4 ??F) (Temporal)   09/21/22 36.3 ??C (97.3 ??F) (Temporal)     BP Readings from Last 3 Encounters:   04/19/23 119/80   03/24/23 122/87   09/21/22 106/74     Pulse Readings from Last 3 Encounters:   04/19/23 91   03/24/23 100   09/21/22 71       Objective:     General Appearance: Alert, cooperative, no distress  Head and Neck: Normocephalic, atraumatic.  EENT: EOMI, conjunctiva clear. No nasal congestion  Respiratory: Respiratory effort unremarkable. No cough or audible wheeze  Musculoskeletal: Gait and station unremarkable.   Neurologic: Alert and oriented x3. CN II-XII grossly intact.   Psychiatric: Mood and affect appropriate for situation.

## 2023-04-19 ENCOUNTER — Ambulatory Visit
Admit: 2023-04-19 | Discharge: 2023-04-20 | Payer: PRIVATE HEALTH INSURANCE | Attending: Student in an Organized Health Care Education/Training Program | Primary: Student in an Organized Health Care Education/Training Program

## 2023-04-19 DIAGNOSIS — Z113 Encounter for screening for infections with a predominantly sexual mode of transmission: Principal | ICD-10-CM

## 2023-04-19 DIAGNOSIS — D693 Immune thrombocytopenic purpura: Principal | ICD-10-CM

## 2023-04-19 DIAGNOSIS — Z1159 Encounter for screening for other viral diseases: Principal | ICD-10-CM

## 2023-04-19 DIAGNOSIS — E66811 Obesity, Class I, BMI 30-34.9: Principal | ICD-10-CM

## 2023-04-19 DIAGNOSIS — F321 Major depressive disorder, single episode, moderate: Principal | ICD-10-CM

## 2023-04-19 DIAGNOSIS — F419 Anxiety disorder, unspecified: Principal | ICD-10-CM

## 2023-04-19 DIAGNOSIS — D8989 Other specified disorders involving the immune mechanism, not elsewhere classified: Principal | ICD-10-CM

## 2023-04-19 DIAGNOSIS — K5080 Crohn's disease of both small and large intestine without complications: Principal | ICD-10-CM

## 2023-04-19 DIAGNOSIS — E559 Vitamin D deficiency, unspecified: Principal | ICD-10-CM

## 2023-04-19 DIAGNOSIS — Z23 Encounter for immunization: Principal | ICD-10-CM

## 2023-04-19 LAB — COMPREHENSIVE METABOLIC PANEL
ALBUMIN: 4.3 g/dL (ref 3.4–5.0)
ALKALINE PHOSPHATASE: 63 U/L (ref 46–116)
ALT (SGPT): 20 U/L (ref 10–49)
ANION GAP: 13 mmol/L (ref 5–14)
AST (SGOT): 19 U/L (ref <=34)
BILIRUBIN TOTAL: 0.3 mg/dL (ref 0.3–1.2)
BLOOD UREA NITROGEN: 10 mg/dL (ref 9–23)
BUN / CREAT RATIO: 13
CALCIUM: 10.3 mg/dL (ref 8.7–10.4)
CHLORIDE: 98 mmol/L (ref 98–107)
CO2: 29 mmol/L (ref 20.0–31.0)
CREATININE: 0.75 mg/dL (ref 0.55–1.02)
EGFR CKD-EPI (2021) FEMALE: >90 mL/min/{1.73_m2} (ref >=60)
GLUCOSE RANDOM: 84 mg/dL (ref 70–179)
POTASSIUM: 3.8 mmol/L (ref 3.4–4.8)
PROTEIN TOTAL: 8.8 g/dL — ABNORMAL HIGH (ref 5.7–8.2)
SODIUM: 140 mmol/L (ref 135–145)

## 2023-04-19 LAB — HEMOGLOBIN A1C
ESTIMATED AVERAGE GLUCOSE: 97 mg/dL
HEMOGLOBIN A1C: 5 % (ref 4.8–5.6)

## 2023-04-19 LAB — TSH: THYROID STIMULATING HORMONE: 1.452 u[IU]/mL (ref 0.550–4.780)

## 2023-04-19 MED ORDER — FERROUS SULFATE 325 MG (65 MG IRON) TABLET
ORAL_TABLET | ORAL | 1 refills | 90.00 days | Status: CP
Start: 2023-04-19 — End: 2023-10-16

## 2023-04-20 DIAGNOSIS — E559 Vitamin D deficiency, unspecified: Principal | ICD-10-CM

## 2023-04-20 LAB — VITAMIN D 25 HYDROXY: VITAMIN D, TOTAL (25OH): 12 ng/mL — ABNORMAL LOW (ref 20.0–80.0)

## 2023-04-20 MED ORDER — ERGOCALCIFEROL (VITAMIN D2) 1,250 MCG (50,000 UNIT) CAPSULE
ORAL_CAPSULE | ORAL | 0 refills | 84.00 days | Status: CP
Start: 2023-04-20 — End: 2023-07-07

## 2023-04-21 DIAGNOSIS — R768 Other specified abnormal immunological findings in serum: Principal | ICD-10-CM

## 2023-04-21 DIAGNOSIS — D8989 Other specified disorders involving the immune mechanism, not elsewhere classified: Principal | ICD-10-CM

## 2023-04-21 LAB — HEPATITIS B SURFACE ANTIBODY
HEPATITIS B SURFACE ANTIBODY QUANT: <8 m[IU]/mL (ref <8.00)
HEPATITIS B SURFACE ANTIBODY: NONREACTIVE

## 2023-04-21 LAB — HEPATITIS B CORE ANTIBODY, TOTAL: HEPATITIS B CORE TOTAL ANTIBODY: NONREACTIVE

## 2023-04-21 LAB — HEPATITIS B SURFACE ANTIGEN: HEPATITIS B SURFACE ANTIGEN: NONREACTIVE

## 2023-04-21 LAB — HEPATITIS C ANTIBODY: HEPATITIS C ANTIBODY: NONREACTIVE

## 2023-04-21 NOTE — Unmapped (Signed)
 [facility] Ambulatory E-Consult Note         Thank you for your Ambulatory E-Consult.     To summarize:   23 year old woman with a number of medical conditions:   Chrohn's disease diagnosed 2018.  She has been on IV Infliximab since 2019, now on the subcu preparation.   Eosinophilic esopahgitis  ITP diagnosed 03/2022.  Treated with steroids, IVIg, NPlate and now in remission.  Infliximab held at that time with the concern that it was the cause of the ITP, but she was able to successfully resume treatment with good platelet counts to current.   Autoimmune serologies sent in the setting of new ITP in 2024.  ANA and SSA positive, the rest negative. dsDNA negative, no complements sent.  I have reviewed the notes and do not see any indication of other organ involvement (besides heme) of concern for lupus or other autoimmune disorder.  No mention of arthritis, rash, serositis, proteinuria etc.   Most recent CBC, chemistries, UA reviewed and no concerns.     My recommendations are as follows:   A few thoughts:  Autoimmune serologies are often seen in the setting of long term Infliximab infusion.  This is usually not associated with clinical autoimmune disease.  Drug induced lupus is well described in the setting of Infliximab use, but this patient is currently symptoms free and platelets have recovered. No indication to stop or change infliximab  ITP may be the first manifestation of lupus.  It is possible that she will develop additional signs and symptoms in the future, but right now, all is fine.  She has very careful and close clinical followup with a number of physicians-- primary care, GI, Heme.  If she develops ITP again or other signs/symptoms of autoimmunity, we should evaluate.      I spent 26-30 minutes in medical consultative verbal and/or internet discussion and review of medical records, including a written report to the treating provider via electronic health record regarding the condition of this patient. This Ambulatory E-Consult did include an answerable clinical question and did not recommend a clinic visit.    Rise Paganini, MD      This Ambulatory E-Consult is based solely on the clinical information available to me in the patient's medical record and is provided without benefit of a comprehensive evaluation or physical examination of the patient. The information contained in this E-Consult must be interpreted in light of any clinical issues or changes in patient status that were not known to me at the time the E-Consult was completed. You must rely on your own informed clinical judgement for decision making. If necessary, refer the  patient for an in-office consultation. Please contact me if you have further questions.

## 2023-05-25 NOTE — Unmapped (Signed)
 Assessment and Plan:       23 yo female presents for follow up     (1) Crohn's disease   -following with GI, last visit was on 10/12/2022:  Jennifer Huynh is a 23 y.o. female is seen in the Pediatric GI clinic for follow up regarding likely Crohn's disease with moderate to severe right-sided colitis, diagnosed on 05/13/16, and eosinophilic esophagitis.  1. Disease activity inactive.   2. Treatment adherence satisfactory.   3. Nutrition status satisfactory.   4. Growth status satisfactory.   5. 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   6.  Needs adult PCP.   7.  Needs several immunizations.   8.  Eye exam UTD, needs full body skin exam.   9.   Mood and anxiety issues identified on screening today, no SI.   10.   Did well on transition to adult care assessment but is not adherent with daily medications.   Plan:   Start Inflectra 120 mg subcutaneous every 2 weeks per Dr. Jacqlyn Krauss.   2.    Recommend [facility] for your primary care provider. This is the closest to your house. Located at [address] [place], [place] [address]. Phone number is: [phone]. It is essential to establish primary care asap. I have asked our social worker to help you with this.   3. After you find PCP, you will then need 2 vaccines, PCV-20 and Hep B booster. You will then need your Hep B immunity checked. You may need up to 3 Hep B boosters. We can send the PCP a letter regarding what is needed once you have established care. I also recommend a flu vaccine in the fall.   3. You should be taking daily vitamin D supplement of 2000 IUs. Take one regular strength Tums with it for extra calcium.   4. Recommend yearly skin surveillance. Make appointment with your local dermatologist.   5. Recommend yearly eye exam as you have been doing.   6. Your nurse, Mardella Layman will send you an updated 504 plan that you can upload to the disability or accessibility site at [facility].   8. Try to continue gym workouts as much as possible. This will help your anxiety and your weight.   9.   Once you have established with a PCP, recommend getting mental health provider information from them.   10. Recommend contraception on your part. There are many options besides OCPs   11. You are doing well with self-management (contacting Dr. Jacqlyn Krauss and making your own appointments), education surrounding disease and transition to adult care. Need to be adherent to daily medications such as vitamin D as discussed.   12. Return to Dr. Jacqlyn Krauss as scheduled, likely for one more visit.     -Offered formal GI referral. Referral placed   -     Ambulatory referral to Gastroenterology      (2) Thrombocytopenia, ITP, IDA  Following with hematology, Visit on 03/24/2023:   Assessment/Recommendations:   Jennifer Huynh is a 23 y.o. White female with a PMHx of Crohn's disease (currently on Remicade (infliximab), who we are seeing in follow up   1. Thrombocytopenia, ITP: Diagnosed 03/2022, with platelet count <3. Treated with IVIG, steroids with response. Quickly relapsed in 04/2022. Decadron was repeated and she was started on N-plate. Drug Ab to Infliximab were ruled out. She has NOT received Rituximab. Since 04/2022, platelet counts improved and she was able to wean off N-plate, last dose 07/20/22. Since that  time, platelet range 210-410. She has no new bleeding, bruising, petechial rashes, and menstrual periods are relatively normal. Will defer Rituximab for now, as platelet count is normal and she is on infliximab for Crohn's disease. Will reconsider if ITP flares again. The goal of ITP treatment is to keep the platelet count above 30 or so.  The platelet count may need to be raised to a higher level acutely in case of bleeding, trauma, or prior to an invasive procedure or surgery. Platelet count today is 261. No ITP treatment needed at this time. Will continue to check labs Q3M at [facility], and PRN for bleeding episodes. Standing orders placed today.  2. Iron deficiency anemia: Hgb drop from 12.8 on 03/03/22 to nadir of 11.2 on 04/21/22, likely due to blood losses. On chart review, she has a longstanding history of iron deficiency without anemia, since at least 2018. B-12 and folate normal as of 03/2022. She received 1000 mg total dose of IV Ferrlecit during hospitalization in 03/2022. At her previous visit in 08/2022, recommended she start an OTC iron supplement daily (325 mg ferrous sulfate or equivalent), but this was not done. Today Hgb 14, MCV 82.8, ferritin 14.7. No symptoms of anemia today.  Again recommend starting PO iron supplement daily to every other day, OR MVI with 18 mg iron daily, whichever is better tolerated.  3. Crohn's disease: Followed by Dr. Jacqlyn Krauss at [facility] Antibodies to infliximab were negative, so she has resumed it for tx of Crohn's disease. Currently self-injecting Q2W. 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   4. Positive ANA/ENA: Has not seen rheumatology. No sx of lupus RA currently. Will continue to monitor for symptoms and re-refer to rheumatology PRN.   Plan:  - Labs today: CBC, ferritin  - Repeat CBC Q3M and PRN for bleeding  - No need for ITP directed therapy at this time.  - May need to consider Rituximab in the future if ITP recurs  - Start OTC iron supplement vs MVI with 18 mg iron daily as tolerated  - Continue to f/u with GI for management of Crohn's disease  - RTC 12 month  -Hematology visit scheduled in 03/2024      (3) Positive ANA/ENA  -Electronic consult placed to rheumatology on 04/21/2023:  To summarize:   23 year old woman with a number of medical conditions:   Chrohn's disease diagnosed 2018.  She has been on IV Infliximab since 2019, now on the subcu preparation.   Eosinophilic esopahgitis  ITP diagnosed 03/2022.  Treated with steroids, IVIg, NPlate and now in remission.  Infliximab held at that time with the concern that it was the cause of the ITP, but she was able to successfully resume treatment with good platelet counts to current.   Autoimmune serologies sent in the setting of new ITP in 2024.  ANA and SSA positive, the rest negative. dsDNA negative, no complements sent.  I have reviewed the notes and do not see any indication of other organ involvement (besides heme) of concern for lupus or other autoimmune disorder.  No mention of arthritis, rash, serositis, proteinuria etc.   Most recent CBC, chemistries, UA reviewed and no concerns.   My recommendations are as follows:   A few thoughts:  Autoimmune serologies are often seen in the setting of long term Infliximab infusion.  This is usually not associated  with clinical autoimmune disease.  Drug induced lupus is well described in the setting of Infliximab use, but this patient is currently symptoms free and platelets have recovered. No indication to stop or change infliximab  ITP may be the first manifestation of lupus.  It is possible that she will develop additional signs and symptoms in the future, but right now, all is fine.  She has very careful and close clinical followup with a number of physicians-- primary care, GI, Heme.  If she develops ITP again or other signs/symptoms of autoimmunity, we should evaluate      (4) Depression, anxiety  -PHQ-9 score: 11 >8  -GAD-7 score: 17> 13  -strong FH of psychiatric conditions (father has bipolar disorder, paternal grandmother had schizophrenia and bipolar disorder and committed suicide)   She was previously on celexa. We discussed concern regarding SSRI given her thrombocytopenia/ITP   -She is interested in [facility]. Referral placed to LCSW  -Attempted previously to place psychiatry referral however she does not qualify due to location. Will place e-consult to psychiatry to assist with which medication to start given PMH of ITP    -     Ambulatory Referral to Social Work; Future  -     Ambulatory e-Consult to Psychiatry      HEALTH MAINTENANCE  HIV negative, Hepatitis C negative (04/18/22)  -Pap smear on 09/16/2022: Comment: NEGATIVE FOR INTRAEPITHELIAL LESION OR MALIGNANCY. FUNGAL ORGANISMS MORPHOLOGICALLY CONSISTENT WITH CANDIDA SPECIES ARE PRESENT.  Repeat pap in 08/2025  -She is due for: Pneumococcal vaccine, meningococcal. We unfortunately do not have meningococcal. She received Pneumococcal without difficulty. VIS provided  -     PNEUMOCOCCAL CONJUGATE VACCINE 20-VALENT  -     Cancel: MENINGOCOCCAL B, OMV-PF 10-25 (BEXSERO)  -Refill provided on Vitamin D and iron  -     ergocalciferol-1,250 mcg, 50,000 unit, (DRISDOL) 1,250 mcg (50,000 unit) capsule; Take 1 capsule (1,250 mcg total) by mouth once a week for 12 doses.  -     ferrous sulfate; Take 1 tablet (325 mg total) by mouth every other day.      To follow up in 2 months to reassess depression, anxiety.   -Will discuss repeating Vitamin D, CBC + iron  -Additionally will discuss providing Hepatitis B vaccine as titers reveal she is not immune         Subjective:     Jennifer Huynh 22 y.o.female   is here for follow up   Chief Complaint   Patient presents with    Follow-up    Immunizations     She would like a therapist   She does not feel depressed but she notes her mood and anxiety are more of a struggle.   She does sleep a lot     School is going well     She has 1 soda a day versus before was smoking all day   She has been trying to get 7,000 steps in a day   She has been drinking 2 L of water   She is counting her calories, trying to eat when she is hungry and not when she is bored       Patient's last menstrual period was 05/11/2023.          ROS:     ROS negative unless otherwise noted in HPI    Vital Signs:     Wt Readings from Last 3 Encounters:   05/26/23 99.1 kg (218 lb 8 oz)   04/19/23 Marland Kitchen)  101.2 kg (223 lb)   03/24/23 (!) 101.2 kg (223 lb)     Temp Readings from Last 3 Encounters:   05/26/23 37.2 ??C (98.9 ??F)   04/19/23 36.2 ??C (97.2 ??F)   03/24/23 36.3 ??C (97.4 ??F) (Temporal)     BP Readings from Last 3 Encounters:   05/26/23 134/88   04/19/23 119/80   03/24/23 122/87     Pulse Readings from Last 3 Encounters:   05/26/23 99   04/19/23 91   03/24/23 100       Objective:     General Appearance: Alert, cooperative, no distress  Head and Neck: Normocephalic, atraumatic.  EENT: EOMI, conjunctiva clear. No nasal congestion  Cardiac: Tachycardic but regular rhythm. No murmur noted  Respiratory: Respiratory effort unremarkable. No cough or audible wheeze. Lungs are clear to auscultation   Musculoskeletal: Gait and station unremarkable.   Neurologic: Alert and oriented x3. CN II-XII grossly intact.   Psychiatric: Mood and affect appropriate for situation. Very pleasant.

## 2023-05-26 ENCOUNTER — Ambulatory Visit
Admit: 2023-05-26 | Discharge: 2023-05-27 | Attending: Student in an Organized Health Care Education/Training Program | Primary: Student in an Organized Health Care Education/Training Program

## 2023-05-26 DIAGNOSIS — K5080 Crohn's disease of both small and large intestine without complications: Principal | ICD-10-CM

## 2023-05-26 DIAGNOSIS — Z23 Encounter for immunization: Principal | ICD-10-CM

## 2023-05-26 DIAGNOSIS — F321 Major depressive disorder, single episode, moderate: Principal | ICD-10-CM

## 2023-05-26 DIAGNOSIS — E559 Vitamin D deficiency, unspecified: Principal | ICD-10-CM

## 2023-05-26 DIAGNOSIS — F419 Anxiety disorder, unspecified: Principal | ICD-10-CM

## 2023-05-26 DIAGNOSIS — D693 Immune thrombocytopenic purpura: Principal | ICD-10-CM

## 2023-05-26 MED ORDER — FERROUS SULFATE 325 MG (65 MG IRON) TABLET
ORAL_TABLET | ORAL | 1 refills | 90 days | Status: CP
Start: 2023-05-26 — End: 2023-11-22

## 2023-05-26 MED ORDER — ERGOCALCIFEROL (VITAMIN D2) 1,250 MCG (50,000 UNIT) CAPSULE
ORAL_CAPSULE | ORAL | 0 refills | 84 days | Status: CP
Start: 2023-05-26 — End: 2023-08-12

## 2023-05-27 DIAGNOSIS — F321 Major depressive disorder, single episode, moderate: Principal | ICD-10-CM

## 2023-05-27 NOTE — Unmapped (Signed)
 Thank you for your e-Consult.    REASON FOR CONSULTATION: recommended for anxiety/depression with PMH of ITP     History Gathered by Chart Review  23 yo F with history of Crohn's disease, acute immune thrombocytopenic purpura, MDD, and anxiety. The patient reported previously being on Celexa (although reported low adherence), but concerns about bleeding risk with SSRIs given her ITP.    PHQ-9 PHQ-9 Total Score   05/26/2023  11:00 AM 8   04/19/2023   2:30 PM 11     GAD7 Total Score GAD-7 Total Score   05/26/2023  11:00 AM 13   04/19/2023   2:30 PM 17           Most recent platelet levels:   03/24/23 - 261  12/29/2022 - 287  11/09/2022 - 289      Provisional Diagnoses:  Major depressive disorder by history  Unspecified anxiety disorder    Assessment: Patient with mild depression and moderate anxiety based off recent screeners. Prior history of Celexa treatment with unclear benefit (although perhaps not great adherence?). Her most recent platelet levels are low, but not in the range where I have great concerns about the effects of serotonergic medications having significant impact on platelet function leading to a higher risk of bleeding. However, given the theoretical risk, it is probably best to start with antidepressants that do not have significant effects on platelet function, such as bupropion or mirtazapine.  See below for detailed recommendations.    Recommendations:   -Consider trial of Bupropion XL 150 mg (can be titrated up to 300 mg after 4-6 weeks if tolerated but not fully effective).   -Bupropion does not have much serotonergic activity, but it may not be as effective for the treatment of anxiety as other antidepressants. May lead to some weight loss.  OR  -Consider trial of Mirtazapine 15 mg po at bedtime (can be titrated to 30 mg after 4-6 weeks if tolerated but not fully effective).   -Mirtazapine acts on serotonergic receptors and does not directly lead to changes in platelet function.   -Mirtazapine can lead to increased appetite and weight gain   -May be more effective for anxiety than Bupropion    -If neither of these medications are effective/tolerated, could consider trial of SSRI or SNRI under close observation for bleeding risk.    -Can also consider Hydroxyzine 25-50 mg TID prn severe anxiety.         I spent 21-25 minutes in medical consultative discussion and review of medical records, including a written report to the treating provider via electronic health record regarding the condition of this patient.    This e-Consult did include an answerable clinical question and did not recommend a clinic visit.      This eConsult is based solely on the clinical information available to me in the patient's medical record and is provided without benefit of a comprehensive evaluation or physical examination of the patient. The information contained in this eConsult must be interpreted in light of any clinical issues or changes in patient status that were not known to me at the time the eConsult was completed. You must rely on your own informed clinical judgement for decision making. If necessary, we can schedule the patient for an in person consultation. Please contact me if you have further questions.        Rose Phi, MD

## 2023-05-27 NOTE — Unmapped (Signed)
 Abstraction Result Flowsheet Data    This patient's last AWV date: [facility] Last Medicare Wellness Visit Date: Not Found  This patients last WCC/CPE date: : Not Found      Reason for Encounter  Reason for Encounter: Outreach  Primary Reason for Outreach: [employer] Follow Up  Text Message: No  MyChart Message: Yes

## 2023-05-30 NOTE — Unmapped (Signed)
 Suicide & Crisis Hotlines       4 Suicide and Crisis Lifeline  Call or text 17  Online Chat: www.988lifeline.org    The Hopeline   Phone: 304-739-4730    Online chat: www.hopeline.com    [place]   Phone: 1-800-SUICIDE ((236)692-0975)    National Suicide Prevention Lifeline    Phone: 8-413-244-WNUU (9084835064)   Online chat: www.suicidepreventionlifeline.org    Online chat (ages 38-24): https://gibson.com/        If you are experiencing a psychiatric emergency, you can call 911 or go to your nearest emergency room. Many counties have a specialized crisis center where you can walk in for a crisis assessment and referrals to additional services. Appointments are not needed, and they are open 24 hours a day, 7 days a week    24/7 Crisis Centers      [facility] (Adult patients only)  [address] [place] [address]  [phone]  Sunday - Saturday - 24 hours/day    [facility] (Adult and Pediatric patients)  [address] [place] [address]  [phone]  Sunday - Saturday - 24 hours/day    How to Find Mental Health Treatment   If you have health insurance, look on your insurance card for the phone number or website to find a list of mental health providers who accept your insurance.      If you have Medicaid, Medicare, or no insurance, see below for your county's MCO. Call the phone number to make an appointment or get more information.    If you have Medicaid, you can also get extra help finding services from the [facility] [place] Office. Call them at [phone] from 8 a.m. to 5 p.m., Monday through Friday, except State holidays.      Mobile Crisis   Crisis situations are often best resolved at home. Mobile Crisis Teams are available 24 hours a day in all counties. Professional counselors will speak with you and your family during a visit. They have an average response time of 2 hours.     [employer] Served: [place], [place], [place], [place], [place], [place], [place]  Member and Recipient Services: [phone] - BH Crisis Line: [phone].    Website: AdSolver.gl  24/7 Mobile Crisis:    [employer] Crisis: 9788887126 - [phone]  [place], [place], [place], [place], [place], [place], [place])    [facility] Mobile Crisis: [phone]  [place], [place], [place], [place], [place], [place])    [facility] [employer] Crisis: [phone]  [facility])    Behavioral Health Urgent Care:    [facility]  [place], [place], [place], [place], [place], [place], [place])  Address: [address] [place], [place] [address]  Phone: [phone]  Hours: 24 hours a day    [facility] [place])  [place], [place], [place], [place], [place], [place], [place])  Address: [address], [place], [place] [address]   Phone: [phone]  Hours:  Monday-Thursday 8AM-10PM  Friday 8AM-8PM  Saturday-Sunday 8AM-5PM  (Last patients are registered one hour before closing.)      [place] Resources:    [place]---   [facility]   Address: [address] (3rd & 4th Floor) [place], [place]   Phone: [phone]  Hours: M-W, Th-F 8am-3pm; Saturday and Sunday 8am-4pm  [phone] for 24/7    [facility] Recovery Response Center   Address: [address] [place], [place]  Phone: [phone]   Hours: 24 hours a day    [facility] --- MORES [facility] Response, Engagement, and Stabilization)  Address:  [address] [place], [place] [address]  Phone: [phone]  Hours: M-F 10AM-10:30PM  After hours call: [phone]  [facility] Recovery Services  [address] [place], [place]  Phone: [phone]  Hours: M-F 8am-3pm    [facility], [facility]  Address: [address] [place] [place], [place] [address]  Phone: [phone]  Hours: M-F 8am-5pm    [place]---Crisis ED  [address] [place], [place] [address]  Phone: [phone]    [facility] Response, Engagement, and Stabilization)  Address:  [address] [place], [place] [address]  Phone: [phone]  Hours: M-F 10AM-10:30PM  After hours call: [phone]    [facility]  [address] Claris [place], [place]  Phone: [phone]  Hours: M-F 8am-5pm     [place]--RHA  [address], [place]   Phone: [phone]    [facility]  [address] [place], [place]  Phone: [phone]    [facility] Recovery   [address] Kendell [place], [place]   Phone: [phone]   Hours: 24 hours    [facility] --- MORES [facility] Response, Engagement, and Stabilization)  Address:  [address] [place], [place] [address]  Phone: [phone]  Hours: M-F 10AM-10:30PM  After hours call: [phone]      [facility] Response, Engagement, and Stabilization)  Address:  [address] [place], [place] [address]  Phone: [phone]  Hours: M-F 10AM-10:30PM  After hours call: [phone]      [facility] RESOURCES  Counties Served: [place], [place] South, Bush, [place], [place], [place], [place], [place], [address] [place], [place], [place], Engineer, maintenance (IT), [place], [place], [place], [place], [place], [place], [place], Bennett, Lakeside, [place], Morgan [place], W[place], [place]wood [place], Valley [place], O[place], Car[place], [place], [place], Mul[place]a [place], [place], Alpena, Dunk[place], [place]ml[place], [place], [place], [place], [place], [place], [place], [place], [place], [place], Melida Quitter   Crisis Line: [phone]  Website: www.trilliumhealthresources.org    24/7/365 Mobile Crisis    Behavioral Health Crisis Line: [phone]  ([place], [place], [place], Shil[place], Ames, [place], [place], [place]ed[place], North Bet[place], So-[place], Av[place], Engineer, maintenance (IT), Ragan, Hammonds[place], Bunk[place], Delmont, Cedar Crest, Sch[place], Saddle Rock Estates, Med[place], Wolf Creek, [place], Swed[place], Kempton, Volcano Golf Course, Aptos, Salt L[place], Red Cor[place], [place], [place], Bry[place], Sharpsburg, [place], [place], [place] I[place], [place], [employer], Middleton, [place], Wald[place], [place], Bane[place], Ne[place]ah, [place], Bull [place], [place])    [place] [employer] Crisis: [phone]  [place]is, North [place], Neva Seat, Webb Silversmith)    Integrated [employer] Crisis: [phone]  Veto Kemps, Norbourne Estates, [place], Yuma, Websters Crossing, Point Arena, Preston, New [place], Faucett, Sierra Ridge, Ivins, D[place], [place], Clara [place], La Belle, Sedan, Buda, Meadow, Bella [place], Elwood, Grove, Courtland, Lemmon Valley, Petal, Lake [place], Wellsville, Ingenio, Le Roy, [place], Grafton, Englewood, Bolingbroke, Ivanhoe, Maxwell, Morehead, Mill Neck, Tazewell, East [place], Gifford, Riverpoint, [place], Ruthven, W[place], [place], Gluckstadt, [place])    [place] Crisis: [phone]  Gordy Levan, [place])    RHA Mobile Crisis [place]: [phone]  [place], [place], Neshk[place], Yetta Barre, [place], Eb[place], [place] and [employer])      [place] Resources:    [facility] Recovery Services  [address] [place], [place]   Phone: [phone]  Hours: M-F 8am-3pm    [place]---DREAM  [address] [place], [place]  Phone: [phone]  Hours: M-F Mayra Reel - CALL [phone]    [facility]  [address] [place], [place]  Phone: [phone]  Hours:  M-W 8am to 5pm, Th: 8am to 8pm, F: 8am to [address] County---RHA  [address] [place], [place]  Phone: [phone]   Hours: M-F 8am-5pm    [place]--Helping Hand of [employer] [address] Rushie [place], [place]   Phone: [phone]  Hours: M-F 8-5    [facility]  [address] [place], [place]  Phone: [phone]  Hours: M-F, 8am to 3pm    [facility]--[place] [place] Solutions  [address] Maxie Better, [place], [place]  Phone: [phone]  Hours: Chipper Oman to South [place]ork [place]ates - CALL [phone]    [place]---RHA  [address] [place], [place]  Phone: [phone] / 24/7 Mobile Crisis: [phone]  Hours: M-F 8am-5pm    Rosezena Sensor [phone]    [facility]  [address], [place], [place]  Phone: [phone] / 24/7 Mobile Crisis: [phone]  Hours: M-F 8am-3pm    Jewel Baize [place]---PORT  [address] [place], [place]   Phone: [phone]  Hours: M-F 8am-5pm    [place] - CALL [phone]    [place]---PORT  [address], [place], [place]  Phone: [phone]  Hours: M-F 8am to 5pm    [facility]  [address], [place]  Phone: [phone]  Hours: M-F 8am-5pm    [facility]  [address] [place], [place]  Phone: [phone]  Hours: M-F 8am-3pm    Kevan Ny - CALL [phone]    Melina Copa  [address] [facility] [address] [place]by [place], [place]  Phone: [phone]   Hours: M-F 8am-3pm    [place]---[facility]arch  [address] [place], [place]   Phone: [phone]  Hours: M-F 8am-3pm    [place]--Family Service of the [place]  [address], [place]  Phone: [phone]  Hours: M-F 8am to 3pm    [place]--RHA  [address] [place], [place]  Phone: [phone]  Hours: M-F 8am to [facility] [place] [facility]   [address] [place], [place]  Phone: [phone]  Hours: Irish [place] 8:30am to 3pm    [place]--CALL [phone]    [facility] Recovery Services  [address] [place], [place]  Phone: [phone]  Hours: M-F Para Skeans [phone]    Melba Coon [phone]     [facility] Recovery Services  [address] [place], [place]  Phone: [phone]  Hours: M-F 8am-3pm    [facility]  [address], [place], [place]  Phone: [phone]  Hours: M-F 8am-5pm    Charolette Forward [phone]    [facility] Recovery Services  [address] [place], [place]  Phone: [phone]  Hours: M-F 8am-3pm    Christell Constant [facility] Recovery Services  [address] [place], [place]  Phone: [phone]  Hours: M-F 8am-3pm    Burt Ek  [address] [place], [place]  Phone: [phone]  Hours: M-F 8am to 1pm    [place]---PORT  [address] [place], [place]  [phone]  M-F 8to 5    [place]--Access Family Services  [address] [place], [place]  [phone]  M-F 8am to 6pm    [address] [facility] [place] [employer] of [employer]  [address], [place]  [phone]  M-F 8-5    [place]---CALL [phone]  [place]---PORT  [address] [place], [place]   Phone: [phone]    Janina Mayo of [place]  [address], [place], [place]  [phone]    [facility] Counseling  [address]  [phone]    [place] - CALL [phone]    [place]---PORT  [address] Jake Seats [place], [place]  Phone: [phone]   Hours: M-F 8am to 5pm    [facility]  [address] [place], [place]  Phone: [phone]  Hours: M-F 8am to 5pm    [place]---PORT  [address] [place], [place]  Phone: [phone]  Hours: Wednesday 8am-5pm    [facility]  [address], [place], [place]  Phone: [phone]  Hours: M 8-12, T-F, 1-5pm    [facility] Recovery Services  [address] [place], [place]  Phone: [phone]  Hours: M-F 8am-3pm    [facility] Recovery Services  [address] Albin [place], [place]  Phone: [phone]  Hours: M-F 8am to 3pm    [facility] Recovery Services  [address] [place], [place]  Phone: [phone]  Hours: M-F 8am-3pm    [facility]  [address] Salena Saner [place], [place]  Phone: [phone]   Hours: M-F 8am-3pm     [facility] Behavioral  [address] Driscilla [place], [place]   Phone: [phone]  Hours: M, W, F 8am to 11am     [facility]---[facility]  [address] [place], [place]  Phone: [phone]  Hours: M-F 8am-11am, 1pm to 4pm     [place]---[facility]arch  [address] [place], [place]  Phone: [phone]  Hours: M-F Derek [name] - CALL [phone]    [place]  [address], [place]  Phone: [phone]    [place] - CALL [phone]    [facility]  [address] [place], [place]  Phone: [phone]  Hours: M-F 8am-5pm     Lucila Maine  [address] [place], [place]  Phone: [phone]  Hours: M-F 8am-3pm          [facility]  [place] Served: [place], [place] High[place] South, [place], [place], [place], [place], [place], [place], [place], [place], [place], [place], Tollie Eth  Crisis Line: [phone]  Website: [email or web address].org    24/7 Mobile Crisis:    [facility] Crisis: [phone]  Laurell Josephs, [place])    [facility] Recovery Services Mobile Crisis: [phone]  Marga Melnick, Cecil Cobbs, Berton Lan, [place], Renold Don, Vinson Moselle)    [facility] Crisis: [phone])  [facility], Leonette Monarch, & [place])    [facility] 24/7 Crisis: [phone])  Laurell Josephs, [place], Rupert, Pal[place], [place], [place]-Tower Hills-Harbert, [place]ie, [place], [place], R[place], [place], [place], [place], [place])    [place] Resources:    [facility] [employer]  [address] [place], [place]  Phone: [phone] / 24/7 Mobile Crisis: [phone]  Hours: M-F 9am-3pm    [facility] Recovery Services  [address] Laurell Josephs[address] [place], [place]  Phone: [phone]   Hours: M-F 8am-8pm    [facility]  [address] [place], [place]  Phone: [phone]   Hours: M-F 9am-3pm    [place]---[facility]arch  [address]  [place], [place]  Phone: [phone]  Hours: M-F 8am-3pm    [place] Recovery Services  [address] [place], [place]  Phone: [phone]  M, W, F 8am to 12pm    [facility] Recovery Services  [address], [place]  Phone: [phone] / 24/7 Mobile Crisis: [phone]  Hours: M-F 8am-8pm    [facility] Recovery  [address], [place], [place]  Phone: [phone] / 24/7 Mobile Crisis: [phone]  Hours: M-F 8am-5pm    Jamison Oka  [address] [place], [place]  Phone: [phone]  Hours: M-F 8am-3pm     [facility] Recovery Services  [address] Ninfa [place], [place]  Phone: [phone]   Hours: M-F 8am-3pm    [facility]  [address] [place], [place]  Phone: [phone]  Hours: M-F 8am-3pm    [place]--Family Preservation Services   [address] [place], [place]  Phone: [phone]    [facility] Recovery Services  [address] [place], [place]  Phone: [phone]   Hours: M-F 8am-8pm     [facility] Recovery Services  [address] Letitia Lib[place], [place]  Phone: [phone]   Hours: M-F 8am-1pm    [facility] Recovery Services  [address] [place]. [place], [place]   Phone: [phone] / 24/7 Mobile Crisis: [phone]  Hours: M-F 8am-8pm    [place]--[facility] Recovery Services  [address], [place]   Phone: [phone]    [facility] Recovery Services  [address] [place], [place]  Phone: [phone]   Hours: M-F 8am-11:30am        [facility] Served: [place], Lyn Hollingshead, [place], [place], [place], Priddy, Citron[place], [place], [place], East [place], [place], [place], [place], West [place], [place], Navarre, [place], [place], [place], Medley, [place]ons, [place], [place], [place], [place], [place], Cecilie Kicks  Crisis Line: [phone]  Website: www.vayahealth.com    24/7 Mobile Crisis:    [facility] Recovery Services Mobile Crisis: [phone]     ([address] [place], [place], [place], Roda Shutters, [place], [employer], [place], [place], [place])    The [employer] Crisis Team [place]: [phone]  Lyn Hollingshead, [place], [place]as, [place], [place], [place], [place], [place], [place], [place])    [facility] Health Access to [employer] (Mobile Crisis Connection): [phone]  ([address], Lyn Hollingshead, Scientist, physiological, [place], Cross [place], Braddock, Philip, Cresco, Bay [place], Sarat[place], Lawton[place] Acres, Eunice, Panama City Beach, Hills, Broadview [place], [place], Carrboro, Montour [place], Heber [place], [place], Whit[place], [place], [place], Wenonah, Pennock, Carson, Moody, [place], Vivian, [place], Victory [place], [place])    [place] Resources:    [place]---RHA  [address] [place], [place]  Phone: [phone]  Hours: M,W,F 8am to 2pm    [facility]  [address] [place]  Phone: [phone]   Hours: M-F 8am-5pm    [place] [facility] Recovery Services  [address] [place], [place]  Phone: [phone]   Hours: M-F 8am-5pm    [facility] Recovery Services  [address] [place], [place]  Phone: [phone]   Hours: M-F 8am-5pm    Denny Peon [facility] Recovery Services  [address], [place]  Phone: [phone]   Hours: M-F 8am-5pm    [facility] Preservation Services  [address], [place], [place]  Phone: [phone]  Hours: M-F 8am-5pm    [place]--RHA  [address]  Phone: [phone]  Hours: M-F 8:30am to 5:30pm     Paula Libra  [address], [place], [place]  Phone: [phone]   Hours: M-F 8am-5pm    [place]---RHA   [address] [place]  Phone: [phone]  Hours: M-Th, 8am to 3pm    [facility] Recovery Services  [address] [place], [place]   Phone: [phone]    [facility] County---NCG/Appalachian Community Services  [address] [place], [place]  Phone: [phone]   Hours: M-F 9am-5pm    [facility]  [address] [place], [place]  Phone: [phone]   Hours: M-F 9am-5pm    [place]---[facility]  [address], [place], [place]  Phone: [phone]    [facility]  [address] [place], [place]  Phone: [phone]   Hours: M-F 9am-5pm    Ardine Eng [phone]  No Walk-In    [facility] County---NCG/Appalachian [employer] Solutions  [address] [place], [place]  Phone: [phone]  Hours: M-F 9am-5pm    [facility]  [address], [place]  Phone: [phone]  Hours: 8:30am to 5pm    [facility] Preservation Services  [address] [place], [place]  Phone: [phone]  Hours: M-F 9am-1pm    [facility]  [address] [place], [place]  Phone: [phone]  Hours: M-F 9am-5pm    [facility]  [address], [place], [place]  Phone: [phone]   Hours: M-F 9am-5pm    [facility]  [address] [place], [place]  Phone: [phone]  Hours: M-F 9a-5p    [place]---RHA  [address] [place], [place]  Phone: [phone]   Hours: M-F 8am-5pm    Diona [place]---RHA  [address] [place], [place]   Phone: [phone]   Hours: M-F 8am-5pm    [facility]---RHA  [address] [place], [place]  Phone: [phone]   Hours: M-F 9am-5pm    [place]--- CALL [phone] (No Walk-in)    [facility] Preservation Services  [address] [place], [place]   Phone: [phone]  Hours: M-F 9am-5pm    Edison Pace Recovery Services  [address] [place], [place]  Phone: [phone] / 24/7 Mobile Crisis: [phone]  Hours: M-F 8am-3pm    [facility] Recovery Services  [address] [place], [place]  Phone: [phone]    [facility] Recovery Services  [address] [place], [place]  Phone: [phone]   Hours: M-F 8am-8pm    [facility] Recovery Services  [address] [place], [place]  Phone: [phone]  Hours: M, W 11:30am to 1:30, F 8am to 12pm    [facility]  [address] [place], [place]  Phone: [phone]   Hours: M-F 9am-5pm    [facility]  [address] [place]  Phone: [phone]  Hours: M-F 9a to 5p    [facility] Recovery Services  [address] [place]. [place], [place]  Phone: [phone]   Hours: M-F 8am-5pm    Faylene [place]--Recovery Innovations  [address] [place]son [place], [place]  Phone: [phone]    [facility] Recovery Services  [address], [place], [place]  Phone: [phone]   Hours: M-F 8am-5pm    [facility] Recovery Services  [address] [place], [place]  Phone: [phone]   Hours: M-F 8am-5pm     [place]resa Res [place]---RHA  [address] Tessie [place], [place]  Phone: [phone]   Hours: M-F 9a-5p

## 2023-05-30 NOTE — Unmapped (Deleted)
 INITIAL BEHAVIORAL HEALTH ADULT ASSESSMENT    Virtual Visit     PCP: Skip Dull Pap, DO  Address on File: [address], [place] [address], [facility]      Verified as Current Location: Yes  Extended Emergency Contact Information  Primary Emergency Contact: Budreau,Lisa  Address: [address]           [place], [place] [address] [place]  Home Phone: [phone]  Mobile Phone: [phone]  Relation: Mother  Secondary Emergency Contact: Fringer,Justin  Address: [address]           [place], [place] [address] [place]  Home Phone: [phone]  Mobile Phone: [phone]  Relation: Father     Verified Behavioral Health Emergency Contact: Primary      The patient reports they are physically located in [place]  and is currently: {patient location:[address]}. I conducted a {phone audio video visit:101857} .       Jennifer Huynh, 23 y.o., female is here because     [frequency, intensity, duration, impact, current meds/efficacy]: ***  The patient is a 23 yr old female...with a history of anxiety and depression  The patient endorses.Jennifer AasAaron AasAaron AasAaron Huynh                   Psych history  {Psych history:57340:::1} Has tried Celexa; Her father is bipolar, paternal grandmother had Schizophrenia and bipolar (committed suicide)     Symptoms    Depression  {Depression:57464}    {PHQ9RECAP:81810}  PHQ-9 PHQ-9 Total Score   05/26/2023  11:00 AM 8   04/19/2023   2:30 PM 11         PHQ-9 Score:      {select_status_or_delete_smartlist:64641}      Mania  Has there ever been a period of at least four days when you were so happy or excited that you got into trouble, or your family or friends worried about it or a doctor said you were manic? ***    If yes, continue assessing for bipolar disorder. Diagnostic criteria include the concurrent presence of at least FOUR of the following symptoms (one of which must be the first symptom listed): {Bipolar Disorder:57365:::1}    Anxiety  {Anxiety:57367:::1}    {GAD7RECAP:44205}  GAD7 Total Score GAD-7 Total Score   05/26/2023  11:00 AM 13   04/19/2023   2:30 PM 17       Psychosis  {Psychosis:57368:::1}    Trauma related  {Trauma:57465:::1}    Substance Use  {Substance Use:57370:::1}    Adjustment Disorder  {Adjustment Disorder:57371:::1}    Pain  {Pain:57341:::1}    Sleep  {Sleep plan:57342}    Current contributing stressors   {Stressors:57384:::1}    Strengths   {Strengths:57385:::1}    Social history      Are there any racial, cultural, or religious values or experiences that you feel are important for me to know?   ***         Current family structure (Include previous relationships if relevant):  {Family Structure:57466}    What information would be good for me to know about your family of origin that currently affects you?   ***             MENTAL STATUS EXAM    Appearance:   {Appearence:57344::Appropriate dress}   Behavior:  {Behavior:57345}   Motor:  {Motor:57347::No abnormal movements}   Speech/Language:   {Language:57348::Normal rate, volume, tone, fluency}   Mood:  {  Mood:57360}   Affect:  {Affect:57468::Full range, appropriately reactive, mood congruent}   Thought process:  {Thought Process:57350::Logical,Linear,Clear,Coherent,Goal directed}   Thought content:    {Thought Content:57469:x:Denies SI, HI, self-harm, delusions, obsessions, paranoid ideation, or ideas of reference}   Perceptual disturbances:    {Perceptual Disturbances:57470:x:Denies auditory and visual hallucinations}   Orientation:  {Orientation:57471:x}   Attention:  {Attention:57357:x:Alert and attentive}   Concentration:  {Concentration:57358::Able to fully concentrate and attend}   Memory:  {Memory:57472::Immediate short-term, long-term, and recall grossly intact}    Fund of knowledge:   {Knowledge:57473:x:Consistent with level of education and development}   Insight:    {Insight:57474:x:No observable deficit} Judgment:   {Judgement:57475::No observable deficit}   Impulse Control:  {Impulse:57476}     Safety Screening    Does patient have access to guns/firearms? {YES OR NO:23286}    Suicide Risk Factors   EMB Suicide Risk: A suicide risk assessment was performed during this evaluation. This patient is deemed to be at acute risk for suicide due to the following factors:  Denies SI    Violence Risk Factors  {EMB Violence Risk:60416}  {Violence Risk:57477:::1}    Mitigating Factors  These risk factors are mitigated by the following factors:  {Mitigating factors:57478}      PLAN     Orientation to Brief Model    Oriented patient to brief model including:  Up to 12 sessions  Focus on here and now versus past  Identify one or two specific goals to work on  [employer] will be an expected part of treatment process  Community resources for additional behavioral health services will be provided to patient if needed or requested.  Behavioral health visits follow the [facility] policy for no show appointments. Patients who no show, arrive late or cancel within 24 hours of scheduled appointments 3 times within 12 months with an individual provider can be dismissed from the practice. Patient will be contacted after each no show and given the opportunity to reschedule. Exceptions include events that are out of the patient's control including, but not limited to: hospital admission, death in family, accidental, illness. If the clinic has a delayed opening, patients won't be penalized for late arrival.       {TreatmentModelAgreementECM:91440:::1}      RESOURCES    Community Resources  {Community Resources:57403}    Emergency Resources  [place] specific crisis resources have been added to the AVS when necessary    Additional resources available 24 Hours a day:    988 Suicide and [employer], mining or text 988  Online Chat: www.988lifeline.org    [employer]   Phone: (260)660-5458    Online chat: www.hopeline.com    [place]   Phone: 1-800-SUICIDE (301 692 6562)    National Suicide Prevention Lifeline    Phone: 9-562-130-QMVH (617-182-5009)   Online chat: www.suicidepreventionlifeline.org    Online chat (ages 79-24): https://gibson.com/      Educational Resources   {Educational Resources:57404}    Referrals  {Referrals:57406}    Social Determinants of Health screened today. Interventions provided: {ECMSDOHDROP:72168}    Homework assigned for next session:           Anything else pertinent that wasn't addressed during assessment?               Name: Jennifer Huynh   Date: 05/30/2023    MRN: [id number]   DOB: [birth year]    PCP: Skip Dull Pap, DO  Psychotherapy Treatment Plan    # Goal Intervention Person Responsible Frequency  and Duration   1. [name] will participate in outpatient psychotherapy for treatment of *** diagnosis and related psychiatric symptoms. {elmgoalpeople:42967} behavioral health counseling/psychotherapy in the outpatient setting. [name] &/or legally responsible person; Clifford Coudriet B Pearson Picou, LCSW We will meet every {Time; 1 week to 1 month:10250} &/or per patient request. Frequency of outpatient psychotherapy sessions will decrease as [name] reaches {Pronouns:51134:::1} treatment goals. Frequency of outpatient sessions may increase should new needs arise.   2.  {elmgoalpeople:42967} behavioral health counseling/psychotherapy in the outpatient setting. [name] &/or legally responsible person; Constantin Hillery B Taquana Bartley, LCSW We will meet every {Time; 1 week to 1 month:10250} &/or per patient request. Frequency of outpatient psychotherapy sessions will decrease as [name] reaches {Pronouns:51134:::1}treatment goals. Frequency of outpatient sessions may increase should new needs arise.   3.  {elmgoalpeople:42967} behavioral health counseling/psychotherapy in the outpatient setting. [name] &/or legally responsible person; Janesa Dockery B Yutaka Holberg, LCSW We will meet every {Time; 1 week to 1 month:10250} &/or per patient request. Frequency of outpatient psychotherapy sessions will decrease as [name] reaches {Pronouns:51134:::1}treatment goals. Frequency of outpatient sessions may increase should new needs arise.     Date Goal is (R) Revised,   (A) Achieved, (N) New,   Ongoing (O), Discontinued (D) Progress Towards Goal   June 13, 2023 New Goal # 1 is new. Progress towards meeting this goal will be assessed at a future date.   June 13, 2023 New Goal # 2 is new. Progress towards meeting this goal will be assessed at a future date.   June 13, 2023 New Goal # 3 is new. Progress towards meeting this goal will be assessed at a future date.       Page 2 Jennifer Huynh Treatment Plan)    Patient Signature Therapist Signature Date   Verbal agreement during visit Verbal agreement during visit June 13, 2023                    Number of behavioral health visits in the last 18 months : 0

## 2023-06-01 DIAGNOSIS — F419 Anxiety disorder, unspecified: Principal | ICD-10-CM

## 2023-06-01 DIAGNOSIS — F321 Major depressive disorder, single episode, moderate: Principal | ICD-10-CM

## 2023-06-01 MED ORDER — BUPROPION HCL XL 150 MG 24 HR TABLET, EXTENDED RELEASE
ORAL_TABLET | Freq: Every day | ORAL | 2 refills | 30.00 days | Status: CP
Start: 2023-06-01 — End: 2023-08-30

## 2023-06-02 DIAGNOSIS — F321 Major depressive disorder, single episode, moderate: Principal | ICD-10-CM

## 2023-06-02 DIAGNOSIS — F419 Anxiety disorder, unspecified: Principal | ICD-10-CM

## 2023-06-02 MED ORDER — BUPROPION HCL XL 150 MG 24 HR TABLET, EXTENDED RELEASE
ORAL_TABLET | Freq: Every day | ORAL | 2 refills | 30.00 days | Status: CP
Start: 2023-06-02 — End: 2023-08-31

## 2023-06-13 ENCOUNTER — Encounter
Admit: 2023-06-13 | Discharge: 2023-06-14 | Payer: Medicaid (Managed Care) | Attending: Social Worker | Primary: Social Worker

## 2023-06-13 DIAGNOSIS — D693 Immune thrombocytopenic purpura: Principal | ICD-10-CM

## 2023-06-13 DIAGNOSIS — F32A Depression, unspecified depression type: Principal | ICD-10-CM

## 2023-06-13 NOTE — Unmapped (Unsigned)
 INITIAL BEHAVIORAL HEALTH ADULT ASSESSMENT    Virtual Visit     PCP: Skip Dull Pap, DO  Address on File: [address], [place] [address], [facility] [place]      Verified as Current Location: Yes  Extended Emergency Contact Information  Primary Emergency Contact: Molden,Lisa  Address: [address]           [place], [place] [address] [place]  Home Phone: [phone]  Mobile Phone: [phone]  Relation: Mother  Secondary Emergency Contact: Pinkham,Justin  Address: [address]           [place], [place] [address] [place]  Home Phone: [phone]  Mobile Phone: [phone]  Relation: Father     Verified Behavioral Health Emergency Contact: Primary      The patient reports they are physically located in [place]  and is currently: {patient location:[address]}. I conducted a {phone audio video visit:101857} .       Jennifer Huynh, 23 y.o., female is here because     [frequency, intensity, duration, impact, current meds/efficacy]: ***  The patient is a 23 yr old female...with a history of anxiety and depression  The patient endorses.Jennifer AasAaron AasAaron AasAaron Huynh                   Psych history  {Psych history:57340:::1} Has tried Celexa ; Her father is bipolar, paternal grandmother had Schizophrenia and bipolar (committed suicide)     Symptoms    Depression  {Depression:57464}    {PHQ9RECAP:81810}  PHQ-9 PHQ-9 Total Score   05/26/2023  11:00 AM 8   04/19/2023   2:30 PM 11         PHQ-9 Score:      {select_status_or_delete_smartlist:64641}      Mania  Has there ever been a period of at least four days when you were so happy or excited that you got into trouble, or your family or friends worried about it or a doctor said you were manic? ***    If yes, continue assessing for bipolar disorder. Diagnostic criteria include the concurrent presence of at least FOUR of the following symptoms (one of which must be the first symptom listed): {Bipolar Disorder:57365:::1}    Anxiety  {Anxiety:57367:::1}    {GAD7RECAP:44205}  GAD7 Total Score GAD-7 Total Score   05/26/2023  11:00 AM 13   04/19/2023   2:30 PM 17       Psychosis  {Psychosis:57368:::1}    Trauma related  {Trauma:57465:::1}    Substance Use  {Substance Use:57370:::1}    Adjustment Disorder  {Adjustment Disorder:57371:::1}    Pain  {Pain:57341:::1}    Sleep  {Sleep plan:57342}    Current contributing stressors   {Stressors:57384:::1}    Strengths   {Strengths:57385:::1}    Social history      Are there any racial, cultural, or religious values or experiences that you feel are important for me to know?   ***         Current family structure (Include previous relationships if relevant):  {Family Structure:57466}    What information would be good for me to know about your family of origin that currently affects you?   ***             MENTAL STATUS EXAM    Appearance:   {Appearence:57344::Appropriate dress}   Behavior:  {Behavior:57345}   Motor:  {Motor:57347::No abnormal movements}   Speech/Language:   {Language:57348::Normal rate, volume, tone, fluency}   Mood:  {  Mood:57360}   Affect:  {Affect:57468::Full range, appropriately reactive, mood congruent}   Thought process:  {Thought Process:57350::Logical,Linear,Clear,Coherent,Goal directed}   Thought content:    {Thought Content:57469:x:Denies SI, HI, self-harm, delusions, obsessions, paranoid ideation, or ideas of reference}   Perceptual disturbances:    {Perceptual Disturbances:57470:x:Denies auditory and visual hallucinations}   Orientation:  {Orientation:57471:x}   Attention:  {Attention:57357:x:Alert and attentive}   Concentration:  {Concentration:57358::Able to fully concentrate and attend}   Memory:  {Memory:57472::Immediate short-term, long-term, and recall grossly intact}    Fund of knowledge:   {Knowledge:57473:x:Consistent with level of education and development}   Insight:    {Insight:57474:x:No observable deficit} Judgment:   {Judgement:57475::No observable deficit}   Impulse Control:  {Impulse:57476}     Safety Screening    Does patient have access to guns/firearms? {YES OR NO:23286}    Suicide Risk Factors   EMB Suicide Risk: A suicide risk assessment was performed during this evaluation. This patient is deemed to be at acute risk for suicide due to the following factors:  Denies SI    Violence Risk Factors  {EMB Violence Risk:60416}  {Violence Risk:57477:::1}    Mitigating Factors  These risk factors are mitigated by the following factors:  {Mitigating factors:57478}      PLAN     Orientation to Brief Model    Oriented patient to brief model including:  Up to 12 sessions  Focus on here and now versus past  Identify one or two specific goals to work on  [employer] will be an expected part of treatment process  Community resources for additional behavioral health services will be provided to patient if needed or requested.  Behavioral health visits follow the [facility] policy for no show appointments. Patients who no show, arrive late or cancel within 24 hours of scheduled appointments 3 times within 12 months with an individual provider can be dismissed from the practice. Patient will be contacted after each no show and given the opportunity to reschedule. Exceptions include events that are out of the patient's control including, but not limited to: hospital admission, death in family, accidental, illness. If the clinic has a delayed opening, patients won't be penalized for late arrival.       {TreatmentModelAgreementECM:91440:::1}      RESOURCES    Community Resources  {Community Resources:57403}    Emergency Resources  [place] specific crisis resources have been added to the AVS when necessary    Additional resources available 24 Hours a day:    988 Suicide and Engineer, mining or text 988  Online Chat: www.988lifeline.org    [employer]   Phone: 971 860 4680    Online chat: www.hopeline.com    [place]   Phone: 1-800-SUICIDE ((661)501-8012)    National Suicide Prevention Lifeline    Phone: 9-562-130-QMVH (203-406-0012)   Online chat: www.suicidepreventionlifeline.org    Online chat (ages 6-24): https://gibson.com/      Educational Resources   {Educational Resources:57404}    Referrals  {Referrals:57406}    Social Determinants of Health screened today. Interventions provided: {ECMSDOHDROP:72168}    Homework assigned for next session:           Anything else pertinent that wasn't addressed during assessment?               Name: Jennifer Huynh   Date: 06/13/2023    MRN: [id number]   DOB: 31-Aug-2000    PCP: Skip Dull Pap, DO  Psychotherapy Treatment Plan    # Goal Intervention Person Responsible Frequency  and Duration   1. [name] will participate in outpatient psychotherapy for treatment of *** diagnosis and related psychiatric symptoms. {elmgoalpeople:42967} behavioral health counseling/psychotherapy in the outpatient setting. [name] &/or legally responsible person; Aaleigha Bozza B Kiowa Peifer, LCSW We will meet every {Time; 1 week to 1 month:10250} &/or per patient request. Frequency of outpatient psychotherapy sessions will decrease as [name] reaches {Pronouns:51134:::1} treatment goals. Frequency of outpatient sessions may increase should new needs arise.   2.  {elmgoalpeople:42967} behavioral health counseling/psychotherapy in the outpatient setting. [name] &/or legally responsible person; Dovid Bartko B Jazilyn Siegenthaler, LCSW We will meet every {Time; 1 week to 1 month:10250} &/or per patient request. Frequency of outpatient psychotherapy sessions will decrease as [name] reaches {Pronouns:51134:::1}treatment goals. Frequency of outpatient sessions may increase should new needs arise.   3.  {elmgoalpeople:42967} behavioral health counseling/psychotherapy in the outpatient setting. [name] &/or legally responsible person; Xavien Dauphinais B Denali Sharma, LCSW We will meet every {Time; 1 week to 1 month:10250} &/or per patient request. Frequency of outpatient psychotherapy sessions will decrease as [name] reaches {Pronouns:51134:::1}treatment goals. Frequency of outpatient sessions may increase should new needs arise.     Date Goal is (R) Revised,   (A) Achieved, (N) New,   Ongoing (O), Discontinued (D) Progress Towards Goal   June 13, 2023 New Goal # 1 is new. Progress towards meeting this goal will be assessed at a future date.   June 13, 2023 New Goal # 2 is new. Progress towards meeting this goal will be assessed at a future date.   June 13, 2023 New Goal # 3 is new. Progress towards meeting this goal will be assessed at a future date.       Page 2 Jennifer Huynh Treatment Plan)    Patient Signature Therapist Signature Date   Verbal agreement during visit Verbal agreement during visit June 13, 2023                    Number of behavioral health visits in the last 18 months : 0

## 2023-06-13 NOTE — Unmapped (Signed)
 The patient was unable to meet due to an family concern. The appt was rescheduled.

## 2023-06-14 NOTE — Unmapped (Incomplete)
 Suicide & Crisis Hotlines       4 Suicide and Crisis Lifeline  Call or text 17  Online Chat: www.988lifeline.org    The Hopeline   Phone: 304-739-4730    Online chat: www.hopeline.com    [place]   Phone: 1-800-SUICIDE ((236)692-0975)    National Suicide Prevention Lifeline    Phone: 8-413-244-WNUU (9084835064)   Online chat: www.suicidepreventionlifeline.org    Online chat (ages 38-24): https://gibson.com/        If you are experiencing a psychiatric emergency, you can call 911 or go to your nearest emergency room. Many counties have a specialized crisis center where you can walk in for a crisis assessment and referrals to additional services. Appointments are not needed, and they are open 24 hours a day, 7 days a week    24/7 Crisis Centers      [facility] (Adult patients only)  [address] [place] [address]  [phone]  Sunday - Saturday - 24 hours/day    [facility] (Adult and Pediatric patients)  [address] [place] [address]  [phone]  Sunday - Saturday - 24 hours/day    How to Find Mental Health Treatment   If you have health insurance, look on your insurance card for the phone number or website to find a list of mental health providers who accept your insurance.      If you have Medicaid, Medicare, or no insurance, see below for your county's MCO. Call the phone number to make an appointment or get more information.    If you have Medicaid, you can also get extra help finding services from the [facility] [place] Office. Call them at [phone] from 8 a.m. to 5 p.m., Monday through Friday, except State holidays.      Mobile Crisis   Crisis situations are often best resolved at home. Mobile Crisis Teams are available 24 hours a day in all counties. Professional counselors will speak with you and your family during a visit. They have an average response time of 2 hours.     [employer] Served: [place], [place], [place], [place], [place], [place], [place]  Member and Recipient Services: [phone] - BH Crisis Line: [phone].    Website: AdSolver.gl  24/7 Mobile Crisis:    [employer] Crisis: 9788887126 - [phone]  [place], [place], [place], [place], [place], [place], [place])    [facility] Mobile Crisis: [phone]  [place], [place], [place], [place], [place], [place])    [facility] [employer] Crisis: [phone]  [place])    Behavioral Health Urgent Care:    [facility]  [place], [place], [place], [place], [place], [place], [place])  Address: [address] [place], [place] [address]  Phone: [phone]  Hours: 24 hours a day    [facility] [place])  [place], [place], [place], [place], [place], [place], [place])  Address: [address], [place], [place] [address]   Phone: [phone]  Hours:  Monday-Thursday 8AM-10PM  Friday 8AM-8PM  Saturday-Sunday 8AM-5PM  (Last patients are registered one hour before closing.)      [place] Resources:    [place]---   [facility]   Address: [address] (3rd & 4th Floor) [place], [place]   Phone: [phone]  Hours: M-W, Th-F 8am-3pm; Saturday and Sunday 8am-4pm  [phone] for 24/7    [facility] Recovery Response Center   Address: [address] [place], [place]  Phone: [phone]   Hours: 24 hours a day    [facility] --- MORES [facility] Response, Engagement, and Stabilization)  Address:  [address] [place], [place] [address]  Phone: [phone]  Hours: M-F 10AM-10:30PM  After hours call: [phone]  [facility] Recovery Services  [address] [place], [place]  Phone: [phone]  Hours: M-F 8am-3pm    [facility], [facility]  Address: [address] [place] [place], [place] [address]  Phone: [phone]  Hours: M-F 8am-5pm    [place]---Crisis ED  [address] [place], [place] [address]  Phone: [phone]    [facility] Response, Engagement, and Stabilization)  Address:  [address] [place], [place] [address]  Phone: [phone]  Hours: M-F 10AM-10:30PM  After hours call: [phone]    [facility]  [address] Claris [place], [place]  Phone: [phone]  Hours: M-F 8am-5pm     [place]--RHA  [address], [place]   Phone: [phone]    [facility]  [address] [place], [place]  Phone: [phone]    [facility] Recovery   [address] Kendell [place], [place]   Phone: [phone]   Hours: 24 hours    [facility] --- MORES [facility] Response, Engagement, and Stabilization)  Address:  [address] [place], [place] [address]  Phone: [phone]  Hours: M-F 10AM-10:30PM  After hours call: [phone]      [facility] Response, Engagement, and Stabilization)  Address:  [address] [place], [place] [address]  Phone: [phone]  Hours: M-F 10AM-10:30PM  After hours call: [phone]      [facility] RESOURCES  Counties Served: [place], [place] South, Bush, [place], [place], [place], [place], [place], [address] [place], [place], [place], Engineer, maintenance (IT), [place], [place], [place], [place], [place], [place], [place], Bennett, Lakeside, [place], Morgan [place], W[place], [place]wood [place], Valley [place], O[place], Car[place], [place], [place], Mul[place]a [place], [place], Alpena, Dunk[place], Iml[place], [place], [place], [place], [place], [place], [place], [place], [place], [place], Melida Quitter   Crisis Line: [phone]  Website: www.trilliumhealthresources.org    24/7/365 Mobile Crisis    Behavioral Health Crisis Line: [phone]  ([place], [place], [place], Shil[place], Ames, [place], Pier[place], [place]ed[place], North Bet[place], So-[place], Av[place], Engineer, maintenance (IT), Ragan, Hammonds[place], Bunk[place], Delmont, Cedar Crest, Sch[place], Saddle Rock Estates, Med[place], Wolf Creek, [place], Swedona, Kempton, Volcano Golf Course, Aptos, Salt L[place], Red Cor[place], [place], [place], Bry[place], Sharpsburg, [place], [place], [place] I[place], [place], Mc[employer], Middleton, [place], Wald[place], [place], Bane[place], Ne[place]ah, [place], Bull [place], [place])    [place] [employer] Crisis: [phone]  [place]is, North [place], Neva Seat, Webb Silversmith)    Integrated [employer] Crisis: [phone]  Veto Kemps, Norbourne Estates, [place], Yuma, Websters Crossing, Point Arena, Preston, New [place], Faucett, Sierra Ridge, Ivins, D[place], [place], Clara [place], La Belle, Sedan, Buda, Meadow, Bella [place], Elwood, Grove, Courtland, Lemmon Valley, Petal, Lake [place], Wellsville, Ingenio, Le Roy, Jones[place], Grafton, Englewood, Bolingbroke, Ivanhoe, Maxwell, Morehead, Mill Neck, Tazewell, East [place], Gifford, Riverpoint, [place], Ruthven, W[place], [place], Gluckstadt, [place])    [place] Crisis: [phone]  Gordy Levan, [place])    RHA Mobile Crisis [place]: [phone]  [place], Lo[place]ba, Neshk[place], Yetta Barre, [place], Eb[place], [place] and [employer])      [place] Resources:    [facility] Recovery Services  [address] [place], [place]   Phone: [phone]  Hours: M-F 8am-3pm    [place]---DREAM  [address] [place], [place]  Phone: [phone]  Hours: M-F Mayra Reel - CALL [phone]    [facility]  [address] [place], [place]  Phone: [phone]  Hours:  M-W 8am to 5pm, Th: 8am to 8pm, F: 8am to [address] County---RHA  [address] [place], [place]  Phone: [phone]   Hours: M-F 8am-5pm    [place]--Helping Hand of [employer] [address] Rushie [place], [place]   Phone: [phone]  Hours: M-F 8-5    [facility]  [address] [place], [place]  Phone: [phone]  Hours: M-F, 8am to 3pm    [facility]--[place] [place] Solutions  [address] Maxie Better, [place], [place]  Phone: [phone]  Hours: Chipper Oman to South [place]ork Estates - CALL [phone]    [place]---RHA  [address] [place], [place]  Phone: [phone] / 24/7 Mobile Crisis: [phone]  Hours: M-F 8am-5pm    Rosezena Sensor [phone]    [facility]  [address], [place], [place]  Phone: [phone] / 24/7 Mobile Crisis: [phone]  Hours: M-F 8am-3pm    Jewel Baize [place]---PORT  [address] [place], [place]   Phone: [phone]  Hours: M-F 8am-5pm    [place] - CALL [phone]    [place]---PORT  [address], [place], [place]  Phone: [phone]  Hours: M-F 8am to 5pm    [facility]  [address], [place]  Phone: [phone]  Hours: M-F 8am-5pm    [facility]  [address] [place], [place]  Phone: [phone]  Hours: M-F 8am-3pm    Kevan Ny - CALL [phone]    Melina Copa  [address] [facility] [address] [place]by [place], [place]  Phone: [phone]   Hours: M-F 8am-3pm    [place]---[facility]arch  [address] [place], [place]   Phone: [phone]  Hours: M-F 8am-3pm    [place]--Family Service of the [place]  [address], [place]  Phone: [phone]  Hours: M-F 8am to 3pm    [place]--RHA  [address] [place], [place]  Phone: [phone]  Hours: M-F 8am to [facility] [place] [facility]   [address] [place], [place]  Phone: [phone]  Hours: Irish [place] 8:30am to 3pm    [place]--CALL [phone]    [facility] Recovery Services  [address] [place], [place]  Phone: [phone]  Hours: M-F Para Skeans [phone]    Melba Coon [phone]     [facility] Recovery Services  [address] [place], [place]  Phone: [phone]  Hours: M-F 8am-3pm    [facility]  [address], [place], [place]  Phone: [phone]  Hours: M-F 8am-5pm    Charolette Forward [phone]    [facility] Recovery Services  [address] [place], [place]  Phone: [phone]  Hours: M-F 8am-3pm    Christell Constant [facility] Recovery Services  [address] [place], [place]  Phone: [phone]  Hours: M-F 8am-3pm    Burt Ek  [address] [place], [place]  Phone: [phone]  Hours: M-F 8am to 1pm    [place]---PORT  [address] [place], [place]  [phone]  M-F 8to 5    [place]--Access Family Services  [address] [place], [place]  [phone]  M-F 8am to 6pm    [address] [facility] [place] [employer] of [employer]  [address], [place]  [phone]  M-F 8-5    [place]---CALL [phone]  [place]---PORT  [address] [place], [place]   Phone: [phone]    Janina Mayo of [place]  [address], [place], [place]  [phone]    [facility] Counseling  [address]  [phone]    [place] - CALL [phone]    [place]---PORT  [address] Seats [place], [place]  Phone: [phone]   Hours: M-F 8am to 5pm    [facility]  [address] [place], [place]  Phone: [phone]  Hours: M-F 8am to 5pm    [place]---PORT  [address] [place], [place]  Phone: [phone]  Hours: Wednesday 8am-5pm    [facility]  [address], [place], [place]  Phone: [phone]  Hours: M 8-12, T-F, 1-5pm    [facility] Recovery Services  [address] [place], [place]  Phone: [phone]  Hours: M-F 8am-3pm    [facility] Recovery Services  [address] Albin [place], [place]  Phone: [phone]  Hours: M-F 8am to 3pm    [facility] Recovery Services  [address] [place], [place]  Phone: [phone]  Hours: M-F 8am-3pm    [facility]  [address] Salena Saner [place], [place]  Phone: [phone]   Hours: M-F 8am-3pm     [facility] Behavioral  [address] Driscilla [place], [place]   Phone: [phone]  Hours: M, W, F 8am to 11am     [facility]---[facility]  [address] [place], [place]  Phone: [phone]  Hours: M-F 8am-11am, 1pm to 4pm     [place]---[facility]arch  [address] [place], [place]  Phone: [phone]  Hours: M-F Derek [name] - CALL [phone]    [place]  [address], [place]  Phone: [phone]    [place] - CALL [phone]    [facility]  [address] [place], [place]  Phone: [phone]  Hours: M-F 8am-5pm     Lucila Maine  [address] [place], [place]  Phone: [phone]  Hours: M-F 8am-3pm          [facility]  [place] Served: [place], In[place] High[place] South, [place], [place], [place], [place], [place], [place], [place], [place], [place], [place], Tollie Eth  Crisis Line: [phone]  Website: [email or web address].org    24/7 Mobile Crisis:    [facility] Crisis: [phone]  Laurell Josephs, [place])    [facility] Recovery Services Mobile Crisis: [phone]  Marga Melnick, Cecil Cobbs, Berton Lan, [place], Renold Don, Vinson Moselle)    [facility] Crisis: 855-5CRISIS [phone])  [facility], Leonette Monarch, & [place])    [facility] 24/7 Crisis: [phone] [phone])  Laurell Josephs, [place], Rupert, Pal[place], [place], Shore[place]-Tower Hills-Harbert, [place]ie, [place], [place], R[place], Mat[place], [place], [place], [place])    [place] Resources:    [facility] [employer]  [address] [place], [place]  Phone: [phone] / 24/7 Mobile Crisis: [phone]  Hours: M-F 9am-3pm    [facility] Recovery Services  [address] Laurell Josephs[address] [place], [place]  Phone: [phone]   Hours: M-F 8am-8pm    [facility]  [address] [place], [place]  Phone: [phone]   Hours: M-F 9am-3pm    [place]---[facility]arch  [address]  [place], [place]  Phone: [phone]  Hours: M-F 8am-3pm    [place] Recovery Services  [address] [place], [place]  Phone: [phone]  M, W, F 8am to 12pm    [facility] Recovery Services  [address], [place]  Phone: [phone] / 24/7 Mobile Crisis: [phone]  Hours: M-F 8am-8pm    [facility] Recovery  [address], [place], [place]  Phone: [phone] / 24/7 Mobile Crisis: [phone]  Hours: M-F 8am-5pm    Jamison Oka  [address] [place], [place]  Phone: [phone]  Hours: M-F 8am-3pm     [facility] Recovery Services  [address] Ninfa [place], [place]  Phone: [phone]   Hours: M-F 8am-3pm    [facility]  [address] [place], [place]  Phone: [phone]  Hours: M-F 8am-3pm    [place]--Family Preservation Services   [address] [place], [place]  Phone: [phone]    [facility] Recovery Services  [address] [place], [place]  Phone: [phone]   Hours: M-F 8am-8pm     [facility] Recovery Services  [address] Letitia Libra, [place]  Phone: [phone]   Hours: M-F 8am-1pm    [facility] Recovery Services  [address] [place]. [place], [place]   Phone: [phone] / 24/7 Mobile Crisis: [phone]  Hours: M-F 8am-8pm    [place]--[facility] Recovery Services  [address], [place]   Phone: [phone]    [facility] Recovery Services  [address] [place], [place]  Phone: [phone]   Hours: M-F 8am-11:30am        [facility] Served: [place], Lyn Hollingshead, [place], [place], [place], Priddy, Citron[place], School[place], [place], East [place], [place], [place], [place], West [place], [place], Navarre, [place], [place]up, [place], Medley, [place]ons, [place], [place], [place]ag[place], [place], [place], Cecilie Kicks  Crisis Line: [phone]  Website: www.vayahealth.com    24/7 Mobile Crisis:    [facility] Recovery Services Mobile Crisis: [phone]     ([address] [place], [place], [place], Roda Shutters, [place], [employer], [place], [place], [place])    The [employer] Crisis Team [place]: [phone]  Lyn Hollingshead, [place], [place]as, [place], [place], [place], [place], [place], [place], [place])    [facility] Health Access to [employer] (Mobile Crisis Connection): [phone]  ([address], Lyn Hollingshead, Scientist, physiological, Flint [place], Cross [place], Braddock, Philip, Cresco, Bay [place], Saratoga, Lawton[place] Acres, Eunice, Panama City Beach, Hills, Broadview [place], [place], Carrboro, Montour [place], Heber [place], [place], Whit[place], Person, How[place], Wenonah, Pennock, Carson, Moody, [place], Vivian, [place], Victory [place], [place])    [place] Resources:    [place]---RHA  [address] [place], [place]  Phone: [phone]  Hours: M,W,F 8am to 2pm    [facility]  [address] [place]  Phone: [phone]   Hours: M-F 8am-5pm    [place] [facility] Recovery Services  [address] [place], [place]  Phone: [phone]   Hours: M-F 8am-5pm    [facility] Recovery Services  [address] [place], [place]  Phone: [phone]   Hours: M-F 8am-5pm    Denny Peon [facility] Recovery Services  [address], [place]  Phone: [phone]   Hours: M-F 8am-5pm    [facility] Preservation Services  [address], [place], [place]  Phone: [phone]  Hours: M-F 8am-5pm    [place]--RHA  [address]  Phone: [phone]  Hours: M-F 8:30am to 5:30pm     Paula Libra  [address], [place], [place]  Phone: [phone]   Hours: M-F 8am-5pm    [place]---RHA   [address] [place]  Phone: [phone]  Hours: M-Th, 8am to 3pm    [facility] Recovery Services  [address] [place], [place]   Phone: [phone]    [facility] County---NCG/Appalachian Community Services  [address] [place], [place]  Phone: [phone]   Hours: M-F 9am-5pm    [facility]  [address] [place], [place]  Phone: [phone]   Hours: M-F 9am-5pm    [place]---[facility]  [address], [place], [place]  Phone: [phone]    [facility]  [address] [place], [place]  Phone: [phone]   Hours: M-F 9am-5pm    Ardine Eng [phone]  No Walk-In    [facility] County---NCG/Appalachian [employer] Solutions  [address] [place], [place]  Phone: [phone]  Hours: M-F 9am-5pm    [facility]  [address], [place]  Phone: [phone]  Hours: 8:30am to 5pm    [facility] Preservation Services  [address] [place], [place]  Phone: [phone]  Hours: M-F 9am-1pm    [facility]  [address] [place], [place]  Phone: [phone]  Hours: M-F 9am-5pm    [facility]  [address], [place], [place]  Phone: [phone]   Hours: M-F 9am-5pm    [facility]  [address] [place], [place]  Phone: [phone]  Hours: M-F 9a-5p    [place]---RHA  [address] [place], [place]  Phone: [phone]   Hours: M-F 8am-5pm    Diona [place]---RHA  [address] [place], [place]   Phone: [phone]   Hours: M-F 8am-5pm    [facility] County---RHA  [address] [place], [place]  Phone: [phone]   Hours: M-F 9am-5pm    [place]--- CALL [phone] (No Walk-in)    [facility] Preservation Services  [address] [place], [place]   Phone: [phone]  Hours: M-F 9am-5pm    Edison [facility]ace Recovery Services  [address] [place], [place]  Phone: [phone] / 24/7 Mobile Crisis: [phone]  Hours: M-F 8am-3pm    [facility] Recovery Services  [address] [place], [place]  Phone: [phone]    [facility] Recovery Services  [address] [place], [place]  Phone: [phone]   Hours: M-F 8am-8pm    [facility] Recovery Services  [address] [place], [place]  Phone: [phone]  Hours: M, W 11:30am to 1:30, F 8am to 12pm    [facility]  [address] [place], [place]  Phone: [phone]   Hours: M-F 9am-5pm    [facility]  [address] [place]  Phone: [phone]  Hours: M-F 9a to 5p    [facility] Recovery Services  [address] [place]. [place], [place]  Phone: [phone]   Hours: M-F 8am-5pm    Faylene [place]--Recovery Innovations  [address] [place], [place]  Phone: [phone]    [facility] Recovery Services  [address], [place], [place]  Phone: [phone]   Hours: M-F 8am-5pm    [facility] Recovery Services  [address] [place], [place]  Phone: [phone]   Hours: M-F 8am-5pm     [place]resa Res [place]---RHA  [address] Tessie [place], [place]  Phone: [phone]   Hours: M-F 9a-5p

## 2023-06-29 NOTE — Unmapped (Signed)
 Patient did not show for today's behavioral health appointment. LCSW called patient in hopes of discussing this missed appointment and to reschedule, and patient did not answer.

## 2023-07-01 NOTE — Unmapped (Addendum)
 [facility]  CONSULT NOTE - INFLAMMATORY BOWEL DISEASE  07/04/2023    Demographics:  Jennifer Huynh is a 23 y.o. year old female    Referring physician:   Danetta Dunnings, Benison Pap, DO  [address]  [place],  [place] [address]          HPI / NOTE :     Today, I saw Jennifer Huynh for initial consultation in the [facility] at the request of Dr. Skip Dull regarding Crohn's Disease.  Outside records including available clinical notes, endoscopy reports, imaging results and pathology results were reviewed in detail as part of this initial consultation.     HPI:  Jennifer Huynh presents today to establish care for her ileal and colonic Crohn's disease. She had lost response to Methotrexate, developed antibodies with Humira and was transitioned to Infliximab. 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     She has a history of Iron Deficiency, Vitamin D deficiency, depression, anxiety, IBS-D along with + ANA. Rheumatology feels that this + ANA is seen with long term Infliximab infusion, platelets have recovered on therapy and she is asymptomatic regarding possible drug induced lupus.     From a GI perspective, she reports 1 formed BM daily without blood or abdominal pain. She does have intermittent urgency 3-4 days prior to her next injection (Wednesdays) and may be due to stress (nursing school) or food choices. She has had to stop recently while driving and look for a bathroom after a recent meal. She denies any heartburn or dysphagia. She has been eating better and has intentionally lost ~ 10 lbs in the last few months. She did have mouth sores when first diagnosed.    She does report that her energy is considerably low. Recent TSH testing was in normal range. She had been recommended to start iron supplementation over the counter but has not started yet. Her last ferritin level was 14.7 in Jan 2025.     She is currently going to [employer] for her associates in nursing and will be going into her final year. She does have stress with exams and presentations at school. She has no family history of CRC, IBD or autoimmune disorders. She has had chicken pox as an infant and developed shingles a few years back. She no longer smokes.    Review of Systems: positive for the above   Otherwise, the balance of 10 systems is negative.          IBD HISTORY:     Year of disease onset:  2018  Diagnosis:  Crohn's Disease  Age at onset:   < 35 yr old (A1)  Location:  Ileal and colonic  Behavior:  Nonstricturing,nonpenetrating (B1)  Perianal Dz:  No    Brief IBD Disease Course:      04/2016 presented with blood in stools and abdominal pain, diagnosed with terminal ileum and right sided Crohn's disease. Started steroids.  05/2016 start methotrexate 15 mg weekly to wean her off Entocort  06/2016 drop MTX to 10 mg weekly  07/2016 increase MTX to 15 mg weekly and restart Budesonide due to return of symptoms  09/2016 start Amitriptyline 10 mg HS for functional diarrhea, nausea  09/2016 started Humira for active disease on MTX (stopped)  06/2017 hospital admission for flare, developed antibodies to Humira, started Infliximab  10 mg/kg every 8 weeks and steroid taper  08/2017 started Amitriptyline 25 mg HS  11/2017 return of symptoms, steroid taper started  08/2018 continue Infliximab 10 mg/kg every 8 weeks  08/2019 hospital admission, flare of symptoms, IV steroids  11/2019 diagnosed with Mono  05/2020 increased Infliximab to 20 mg/kg every 8 weeks (low trough level)  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, treated with IVIG and steroids  04/2022 Infliximab being held; checking antigens  05/2022 Infliximab 20 mg/kg resumed  08/2022 transitioned to Infliximab biosimilar 120 mg injections every 2 weeks      Endoscopy:        09/2021 Colonoscopy  Impression: - Perianal skin tags found on perianal exam.                         - The entire examined colon is normal. Biopsied.                         - The examined portion of the ileum was normal.    PATH Terminal ileum, mild chronic active ileitis    09/2021 EGD  Impression: PATH eosinophilic esophagitis              Superficial chronic gastritis    09/2016 Colonoscopy  Impression: - Inflamed mucosa in the mid rectum. Biopsied.    - Inflamed mucosa in the cecum. Biopsied.                      - Crohn's disease, with ileitis and colitis. Biopsied.    PATH Terminal ileum, moderate chronic active ileitis              Right, transverse and left colon, mild chronic active colitis    09/2016 EGD  Impression: - LA Grade A non-reflux esophagitis. Biopsied.                      - Normal stomach. Biopsied.                      - Normal examined duodenum. Biopsied.    04/2016 Colonoscopy  Impression: - Perianal skin tag found on perianal exam. Aptheous ulceration   predominately rectum. Few transverse colon                      - Right colon congested, friable with pseudopolyps suspicion for IBD    PATH right colon, severely active chronic colitis              Transverse, left colon and rectum with mildly active chronic             colitis    04/2016 EGD  Impression: - Normal esophagus. Biopsied.                      - Normal stomach. Biopsied. - Chronic superficial gastritis                      - Normal examined duodenum. Biopsied.    Imaging:      CT A/P  Impression: - No evidence of perirectal fistula or perirectal abscess as clinically   questioned.   - Minimal inflammatory stranding about the cecum with a few mildly  prominent but subcentimeter lymph nodes, likely reactive.  These   inflammatory changes are decreased from prior.   -  Patulous terminal ileum, increased in diameter compared with   prior.     05/2016 MRE  Impression: - Inflammatory changes involving the terminal ileum and ascending   colon, consistent with active Crohn's disease    Prior IBD medications (type, dose, duration, response):    Methotrexate - lost response  Amitriptyline - weight gain  Adalimumab - developed antibodies            Past Medical History:   Past medical history:   Past Medical History:   Diagnosis Date    Anxiety     Crohn disease       History of idiopathic thrombocytopenic purpura     Shingles     Vitamin D deficiency      Past surgical history:   Past Surgical History:   Procedure Laterality Date    PR COLONOSCOPY W/BIOPSY SINGLE/MULTIPLE N/A 05/13/2016    Procedure: COLONOSCOPY, FLEXIBLE, PROXIMAL TO SPLENIC FLEXURE; WITH BIOPSY, SINGLE OR MULTIPLE;  Surgeon: Lorelle Roll Mir, MD;  Location: PEDS PROCEDURE ROOM [facility];  Service: Gastroenterology    PR COLONOSCOPY W/BIOPSY SINGLE/MULTIPLE N/A 10/14/2016    Procedure: COLONOSCOPY, FLEXIBLE, PROXIMAL TO SPLENIC FLEXURE; WITH BIOPSY, SINGLE OR MULTIPLE;  Surgeon: Elene Griffes, MD;  Location: PEDS PROCEDURE ROOM [facility];  Service: Gastroenterology    PR COLONOSCOPY W/BIOPSY SINGLE/MULTIPLE N/A 10/08/2021    Procedure: COLONOSCOPY, FLEXIBLE, PROXIMAL TO SPLENIC FLEXURE; WITH BIOPSY, SINGLE OR MULTIPLE;  Surgeon: Elene Griffes, MD;  Location: PEDS PROCEDURE ROOM [facility];  Service: Gastroenterology    PR UPPER GI ENDOSCOPY,BIOPSY N/A 05/13/2016    Procedure: UGI ENDOSCOPY; WITH BIOPSY, SINGLE OR MULTIPLE;  Surgeon: Lorelle Roll Mir, MD;  Location: PEDS PROCEDURE ROOM [facility];  Service: Gastroenterology    PR UPPER GI ENDOSCOPY,BIOPSY N/A 10/14/2016    Procedure: UGI ENDOSCOPY; WITH BIOPSY, SINGLE OR MULTIPLE;  Surgeon: Elene Griffes, MD;  Location: PEDS PROCEDURE ROOM [facility];  Service: Gastroenterology    PR UPPER GI ENDOSCOPY,BIOPSY N/A 10/08/2021    Procedure: UGI ENDOSCOPY; WITH BIOPSY, SINGLE OR MULTIPLE;  Surgeon: Elene Griffes, MD;  Location: PEDS PROCEDURE ROOM [facility];  Service: Gastroenterology     Family history: No family history on file.  Social history:   Social History Socioeconomic History    Marital status: Single     Spouse name: None    Number of children: None    Years of education: None    Highest education level: None   Tobacco Use    Smoking status: Former     Types: e-Cigarettes     Passive exposure: Current    Smokeless tobacco: Never   Vaping Use    Vaping status: Never Used   Substance and Sexual Activity    Alcohol use: Yes    Drug use: Never     Social Drivers of Psychologist, prison and probation services Strain: Low Risk  (04/21/2022)    Overall Financial Resource Strain (CARDIA)     Difficulty of Paying Living Expenses: Not very hard   Food Insecurity: No Food Insecurity (05/26/2023)    Hunger Vital Sign     Worried About Running Out of Food in the Last Year: Never true     Ran Out of Food in the Last Year: Never true   Transportation Needs: No Transportation Needs (05/26/2023)    PRAPARE - Therapist, art (Medical): No     Lack of Transportation (Non-Medical): No    Received from [facility], [facility]  [place] Short Social Needs Screening - [facility]   Housing: Low Risk  (05/26/2023)    Housing     Within the past 12 months, have you ever stayed: outside, in a car, in a tent, in an overnight shelter, or temporarily in someone else's home (i.e. couch-surfing)?: No     Are you worried about losing your housing?: No             Allergies:     Allergies   Allergen Reactions    Pistachio Nut Shortness Of Breath             Medications:     Current Outpatient Medications   Medication Sig Dispense Refill    buPROPion (WELLBUTRIN XL) 150 MG 24 hr tablet Take 1 tablet (150 mg total) by mouth daily. 30 tablet 2    empty container Misc Use 1 each 3    ergocalciferol-1,250 mcg, 50,000 unit, (DRISDOL) 1,250 mcg (50,000 unit) capsule Take 1 capsule (1,250 mcg total) by mouth once a week for 12 doses. 12 capsule 0    ferrous sulfate 325 (65 FE) MG tablet Take 1 tablet (325 mg total) by mouth every other day. 45 tablet 1    inFLIXimab-dyyb 120 mg/mL PnKt Inject 120 mg under the skin every fourteen (14) days. 8 kit 3     No current facility-administered medications for this visit.             Physical Exam:   BP 108/76 (BP Position: Sitting)  - Pulse 91  - Temp 36.1 ??C (96.9 ??F)  - Ht 172.7 cm (5' 8)  - Wt 99.2 kg (218 lb 12.8 oz)  - LMP 07/02/2023 (Exact Date)  - SpO2 97%  - BMI 33.27 kg/m??     Wt Readings from Last 3 Encounters:   07/04/23 99.2 kg (218 lb 12.8 oz)   05/26/23 99.1 kg (218 lb 8 oz)   04/19/23 (!) 101.2 kg (223 lb)       GEN: no apparent distress, appears comfortable on exam  HEENT: PEERL, anicteric, mucous membranes moist  NEURO:  gait normal, non-focal, no obvious neurologic abnormality  NECK: Supple, no lymphadenopathy  LUNGS: CTAB, no wheezes, rales, or rhonchi  CV: S1/S2, RRR, no murmurs  ABD: Soft, TTP LLQ, nondistended, normoactive bowel sounds, no rebound/guarding, no appreciable organomegaly  Extremities: no cyanosis, clubbing or edema, normal gait  Psych: affect appropriate, A&O x3  SKIN: no visible lesions on face, neck, arms, abdomen  PERIANAL / RECTAL EXAM:   Deferred           Labs, Data & Indices:     Lab Review:   Lab Results   Component Value Date    WBC 6.1 07/04/2023    WBC 8.8 12/29/2022    RBC 4.78 07/04/2023    RBC 4.66 12/29/2022    HGB 13.8 07/04/2023    HGB 13.6 12/29/2022     Lab Results   Component Value Date    AST 15 07/04/2023    AST 9 03/28/2017    ALT 17 07/04/2023    ALT 9 03/28/2017    BUN 8 (L) 07/04/2023    BUN 6 03/28/2017    Creatinine 0.66 07/04/2023    Creatinine 0.71 03/28/2017    CO2 22.8 07/04/2023    CO2 24 03/28/2017    Albumin 3.8 07/04/2023    Calcium 9.8 07/04/2023    Calcium 9.5 03/28/2017     Lab Results   Component Value Date  TSH 1.452 04/19/2023      Lab Results   Component Value Date    Infliximab 14 07/12/2022    Infliximab 2.7 (L) 05/18/2022    Infliximab 5.9 05/04/2022    Infliximab 15 05/19/2021    Infliximab 17 09/29/2020    Infliximab 4.7 (L) 06/09/2020    Infliximab 6.0 01/16/2020 Infliximab 6.3 11/21/2019    Infliximab 4.6 (L) 09/19/2019    Infliximab 5.5 07/25/2019    Anti-Infliximab Ab <20.0 05/18/2022    Anti-Infliximab Ab <20.0 06/09/2020    Anti-Infliximab Ab <20.0 09/19/2019     Lab Results   Component Value Date    Adalimumab 2.7 (L) 07/10/2017    Adalimumab 14.4 12/14/2016    Anti-Adalimumab Ab 18.7 (H) 07/10/2017      ............................................................................................................................................[name]          Assessment & Recommendations:     EVIN CHIRCO is a 23 y.o. female who presents to establish care for her ileal and colonic Crohn's disease. She has been in clinical and endoscopic remission with Infliximab infusions. She was transitioned to subcutaneous dosing for convenience in July 2024. She has return of symptoms intermittently a few days prior to next dose which may be functional diarrhea. Will recheck her drug trough today and obtain restaging colonoscopy in the next 1-2 months. If her levels are not therapeutic, she may need combination therapy with an immunomodulator or need to switch out of class. Will perform upper endoscopy to follow up on previously diagnosed mild Eosinophilic Esophagitis. Will obtain a full set of labs today for her fatigue. Health prevention discussed today.      PLAN:    Continue Infliximab biosimilar injections every 2 weeks; will obtain drug trough today  Will obtain full set of labs today along with nutritional testing for her fatigue  Restaging colonoscopy in the next 1-2 months; will repeat EGD for follow up of Eosinophilic Esophagitis  Prevention: will give 1st Shingrix today, 2 shot series, with the next in 2-6 months; up to date on other vaccines; continue annual skin checks and GYN exams  Return in 6 months or sooner as needed      I personally spent 65 minutes face-to-face and non-face-to-face in the care of this patient, which includes all pre, intra, and post visit time on the date of service.  All documented time was specific to the E/M visit and does not include any procedures that may have been performed.

## 2023-07-04 ENCOUNTER — Ambulatory Visit: Admit: 2023-07-04 | Discharge: 2023-07-05 | Payer: Medicaid (Managed Care)

## 2023-07-04 DIAGNOSIS — K200 Eosinophilic esophagitis: Principal | ICD-10-CM

## 2023-07-04 DIAGNOSIS — Z5181 Encounter for therapeutic drug level monitoring: Principal | ICD-10-CM

## 2023-07-04 DIAGNOSIS — E559 Vitamin D deficiency, unspecified: Principal | ICD-10-CM

## 2023-07-04 DIAGNOSIS — Z23 Encounter for immunization: Principal | ICD-10-CM

## 2023-07-04 DIAGNOSIS — K5080 Crohn's disease of both small and large intestine without complications: Principal | ICD-10-CM

## 2023-07-04 DIAGNOSIS — K5090 Crohn's disease, unspecified, without complications: Principal | ICD-10-CM

## 2023-07-04 LAB — CBC W/ AUTO DIFF
BASOPHILS ABSOLUTE COUNT: 0.1 10*9/L (ref 0.0–0.1)
BASOPHILS RELATIVE PERCENT: 0.9 %
EOSINOPHILS ABSOLUTE COUNT: 0.5 10*9/L (ref 0.0–0.5)
EOSINOPHILS RELATIVE PERCENT: 8.6 %
HEMATOCRIT: 40.3 % (ref 34.0–44.0)
HEMOGLOBIN: 13.8 g/dL (ref 11.3–14.9)
LYMPHOCYTES ABSOLUTE COUNT: 1.9 10*9/L (ref 1.1–3.6)
LYMPHOCYTES RELATIVE PERCENT: 31.3 %
MEAN CORPUSCULAR HEMOGLOBIN CONC: 34.3 g/dL (ref 32.0–36.0)
MEAN CORPUSCULAR HEMOGLOBIN: 28.9 pg (ref 25.9–32.4)
MEAN CORPUSCULAR VOLUME: 84.2 fL (ref 77.6–95.7)
MEAN PLATELET VOLUME: 8 fL (ref 6.8–10.7)
MONOCYTES ABSOLUTE COUNT: 0.4 10*9/L (ref 0.3–0.8)
MONOCYTES RELATIVE PERCENT: 6 %
NEUTROPHILS ABSOLUTE COUNT: 3.3 10*9/L (ref 1.8–7.8)
NEUTROPHILS RELATIVE PERCENT: 53.2 %
PLATELET COUNT: 259 10*9/L (ref 150–450)
RED BLOOD CELL COUNT: 4.78 10*12/L (ref 3.95–5.13)
RED CELL DISTRIBUTION WIDTH: 13.4 % (ref 12.2–15.2)
WBC ADJUSTED: 6.1 10*9/L (ref 3.6–11.2)

## 2023-07-04 LAB — COMPREHENSIVE METABOLIC PANEL
ALBUMIN: 3.8 g/dL (ref 3.4–5.0)
ALKALINE PHOSPHATASE: 60 U/L (ref 46–116)
ALT (SGPT): 17 U/L (ref 10–49)
ANION GAP: 9 mmol/L (ref 5–14)
AST (SGOT): 15 U/L (ref <=34)
BILIRUBIN TOTAL: 0.3 mg/dL (ref 0.3–1.2)
BLOOD UREA NITROGEN: 8 mg/dL — ABNORMAL LOW (ref 9–23)
BUN / CREAT RATIO: 12
CALCIUM: 9.8 mg/dL (ref 8.7–10.4)
CHLORIDE: 106 mmol/L (ref 98–107)
CO2: 22.8 mmol/L (ref 20.0–31.0)
CREATININE: 0.66 mg/dL (ref 0.55–1.02)
EGFR CKD-EPI (2021) FEMALE: >90 mL/min/1.73m2 (ref >=60)
GLUCOSE RANDOM: 101 mg/dL (ref 70–179)
POTASSIUM: 3.9 mmol/L (ref 3.4–4.8)
PROTEIN TOTAL: 8.1 g/dL (ref 5.7–8.2)
SODIUM: 138 mmol/L (ref 135–145)

## 2023-07-04 LAB — VITAMIN B12: VITAMIN B-12: 517 pg/mL (ref 211–911)

## 2023-07-04 LAB — FERRITIN: FERRITIN: 10.4 ng/mL (ref 7.3–270.7)

## 2023-07-04 LAB — IRON & TIBC
IRON SATURATION: 14 % — ABNORMAL LOW (ref 20–55)
IRON: 48 ug/dL — ABNORMAL LOW (ref 50–170)
TOTAL IRON BINDING CAPACITY: 344 ug/dL (ref 250–425)

## 2023-07-04 LAB — C-REACTIVE PROTEIN: C-REACTIVE PROTEIN: <5 mg/L (ref <=10.0)

## 2023-07-04 NOTE — Unmapped (Addendum)
 Continue Infliximab biosimilar injections every 2 weeks; will obtain drug trough today  Will obtain full set of labs today along with nutritional testing for her fatigue  Restaging colonoscopy in the next 1-2 months; will repeat EGD for follow up of Eosinophilic Esophagitis  Prevention: will give 1st Shingrix today, 2 shot series, with the next in 2-6 months; up to date on other vaccines; continue annual skin checks and GYN exams  Return in 6 months or sooner as needed        APPOINTMENT SCHEDULING FOR GI CLINIC AND GI PROCEDURES:  GI MEDICINE CLINIC  [phone] option 1   GI PROCEDURES         [phone] option 2   *To schedule, reschedule, or cancel your GI appointment, please call [phone]. If you are unable to come to an appointment, please notify us  as soon as possible, preferably 24 hours in advance. Doing so may allow other patients with urgent needs to be scheduled in a cancelled appointment slot.   RADIOLOGY - to schedule imaging ordered, please call [phone] opt 1     IBD NURSE COORDINATOR CONTACT - Cathern Clover Yopp, RN  Phone: [phone] (direct line)   Fax: [phone]  * For urgent medical concerns after hours or on weekends and holidays, call [phone] and ask to speak to the GI Fellow on call.    * If you have a GI medical question or GI symptoms and would like to speak to your provider???s healthcare team, please contact Cathern Clover Yopp (contact information above) OR you can send the GI healthcare team a message through MyChart at [email or web address]    TEST RESULTS   If you have a MyChart account, your new results and a provider message will be sent to you through your MyChart account at [email or web address]. For results that require follow-up, a member of your healthcare team will also contact you directly.    PRESCRIPTION REFILL REQUESTS  To request prescription refills, please contact your pharmacy or send your healthcare team a message through your MyChart account at [email or web address]  RECORD REQUESTS  For questions related to medical records, please call Medical Records Release of Information at [phone]  FINANCIAL COUNSELOR   For billing and other financial questions/needs - please contact Reginald Reavis at [phone]. If you need to leave a message, please be sure to leave your full name, date of birth or MR#, best call back # and reason for call.

## 2023-07-04 NOTE — Unmapped (Signed)
 Message sent via MyChart letter for patient to schedule virtual appointment with Social Worker for Brief Therapy .

## 2023-07-05 DIAGNOSIS — K5090 Crohn's disease, unspecified, without complications: Principal | ICD-10-CM

## 2023-07-05 MED ORDER — INFLIXIMAB-DYYB 120 MG/ML SUBCUTANEOUS PEN KIT
PACK | SUBCUTANEOUS | 5 refills | 56.00000 days | Status: CP
Start: 2023-07-05 — End: 2024-01-01
  Filled 2023-07-27: qty 6, 84d supply, fill #0

## 2023-07-05 NOTE — Unmapped (Signed)
 Per [place] Lamona Pilon, need to take over subcutaneous infliximab script, new script sent to [facility] for continued fill.

## 2023-07-06 DIAGNOSIS — K5080 Crohn's disease of both small and large intestine without complications: Principal | ICD-10-CM

## 2023-07-06 DIAGNOSIS — D500 Iron deficiency anemia secondary to blood loss (chronic): Principal | ICD-10-CM

## 2023-07-06 DIAGNOSIS — K5090 Crohn's disease, unspecified, without complications: Principal | ICD-10-CM

## 2023-07-06 NOTE — Unmapped (Signed)
 IRON THERAPY PLAN ORDER ENTRY DOCUMENTATION    The following order(s) have been placed for insurance authorization:    Therapy Plan Order  Medication: iron dextran (INFED)  Dose & Frequency: 1000 mg IV once  Diagnosis: Iron Deficiency & Crohn's Disease  Treatment Center: [facility]      Relevant Iron Labs:  Lab Results   Component Value Date    HGB 13.8 07/04/2023    IRON 48 (L) 07/04/2023    FERRITIN 10.4 07/04/2023    LABIRON 14 (L) 07/04/2023         Requesting Physician: Alverta Avers, NP      Notes: tried and failed ferrous sulfate      Madelene Schanz, PharmD, BCPS, CPP  GI/IBD - Clinical Pharmacist Practitioner

## 2023-07-07 LAB — VITAMIN D 25 HYDROXY: VITAMIN D, TOTAL (25OH): 22.6 ng/mL (ref 20.0–80.0)

## 2023-08-19 DIAGNOSIS — R197 Diarrhea, unspecified: Principal | ICD-10-CM

## 2023-08-19 DIAGNOSIS — K5080 Crohn's disease of both small and large intestine without complications: Principal | ICD-10-CM

## 2023-08-19 MED ORDER — BUDESONIDE DR-ER 9 MG TABLET,DELAYED AND EXTENDED RELEASE
ORAL_TABLET | Freq: Every day | ORAL | 0 refills | 60.00000 days | Status: CP
Start: 2023-08-19 — End: 2023-10-18

## 2023-08-20 ENCOUNTER — Ambulatory Visit
Admit: 2023-08-20 | Discharge: 2023-08-22 | Disposition: A | Discharge status: Home / Self Care | Payer: Medicaid (Managed Care)

## 2023-08-20 ENCOUNTER — Inpatient Hospital Stay
Admit: 2023-08-20 | Discharge: 2023-08-22 | Disposition: A | Discharge status: Home / Self Care | Payer: Medicaid (Managed Care)

## 2023-08-20 ENCOUNTER — Encounter
Admit: 2023-08-20 | Discharge: 2023-08-22 | Disposition: A | Discharge status: Home / Self Care | Payer: Medicaid (Managed Care)

## 2023-08-22 MED ORDER — SULFAMETHOXAZOLE 800 MG-TRIMETHOPRIM 160 MG TABLET
ORAL_TABLET | ORAL | 0 refills | 35.00000 days | Status: CP
Start: 2023-08-22 — End: 2023-09-26

## 2023-08-22 MED ORDER — PANTOPRAZOLE 40 MG TABLET,DELAYED RELEASE
ORAL_TABLET | Freq: Two times a day (BID) | ORAL | 0 refills | 90.00000 days | Status: CP
Start: 2023-08-22 — End: 2023-11-20

## 2023-08-22 MED ORDER — ONDANSETRON 4 MG DISINTEGRATING TABLET
ORAL_TABLET | Freq: Three times a day (TID) | ORAL | 0 refills | 7.00000 days | Status: CP | PRN
Start: 2023-08-22 — End: 2023-08-29

## 2023-08-23 MED ORDER — PREDNISONE 10 MG TABLET
ORAL_TABLET | ORAL | 0 refills | 55.00000 days | Status: CP
Start: 2023-08-23 — End: 2023-10-17

## 2023-09-01 DIAGNOSIS — K625 Hemorrhage of anus and rectum: Principal | ICD-10-CM

## 2023-09-01 DIAGNOSIS — K50811 Crohn's disease of both small and large intestine with rectal bleeding: Principal | ICD-10-CM

## 2023-09-01 DIAGNOSIS — K200 Eosinophilic esophagitis: Principal | ICD-10-CM

## 2023-09-01 DIAGNOSIS — D500 Iron deficiency anemia secondary to blood loss (chronic): Principal | ICD-10-CM

## 2023-09-01 DIAGNOSIS — Z09 Encounter for follow-up examination after completed treatment for conditions other than malignant neoplasm: Principal | ICD-10-CM

## 2023-09-01 DIAGNOSIS — Z862 Personal history of diseases of the blood and blood-forming organs and certain disorders involving the immune mechanism: Principal | ICD-10-CM

## 2023-09-01 MED ORDER — FLUTICASONE PROPIONATE 44 MCG/ACTUATION HFA AEROSOL INHALER
RESPIRATORY_TRACT | 0 refills | 0.00000 days | Status: CP
Start: 2023-09-01 — End: ?

## 2023-09-05 DIAGNOSIS — D693 Immune thrombocytopenic purpura: Principal | ICD-10-CM

## 2023-09-06 ENCOUNTER — Ambulatory Visit: Admit: 2023-09-06 | Discharge: 2023-09-06 | Payer: Medicaid (Managed Care)

## 2023-09-06 DIAGNOSIS — K50012 Crohn's disease of small intestine with intestinal obstruction: Principal | ICD-10-CM

## 2023-09-06 DIAGNOSIS — K5080 Crohn's disease of both small and large intestine without complications: Principal | ICD-10-CM

## 2023-09-06 MED ORDER — BUDESONIDE DR - ER 3 MG CAPSULE,DELAYED,EXTENDED RELEASE
ORAL_CAPSULE | Freq: Every morning | ORAL | 0 refills | 90.00000 days | Status: CP
Start: 2023-09-06 — End: 2023-12-05

## 2023-09-08 DIAGNOSIS — K50919 Crohn's disease, unspecified, with unspecified complications: Principal | ICD-10-CM

## 2023-09-08 DIAGNOSIS — K50012 Crohn's disease of small intestine with intestinal obstruction: Principal | ICD-10-CM

## 2023-09-08 MED ORDER — BISACODYL 5 MG TABLET,DELAYED RELEASE
ORAL_TABLET | Freq: Once | ORAL | 0 refills | 1.00000 days | Status: CP
Start: 2023-09-08 — End: 2023-09-08

## 2023-09-08 MED ORDER — CIPROFLOXACIN 500 MG TABLET
ORAL_TABLET | ORAL | 0 refills | 0.00000 days | Status: CP
Start: 2023-09-08 — End: ?

## 2023-09-08 MED ORDER — METRONIDAZOLE 500 MG TABLET
ORAL_TABLET | 0 refills | 0.00000 days | Status: CP
Start: 2023-09-08 — End: ?

## 2023-09-08 MED ORDER — POLYETHYLENE GLYCOL 3350 17 GRAM ORAL POWDER PACKET
PACK | Freq: Once | ORAL | 0 refills | 1.00000 days | Status: CP
Start: 2023-09-08 — End: 2023-09-08

## 2023-09-14 ENCOUNTER — Ambulatory Visit: Admit: 2023-09-14 | Discharge: 2023-09-15 | Payer: Medicaid (Managed Care)

## 2023-09-14 DIAGNOSIS — K5080 Crohn's disease of both small and large intestine without complications: Principal | ICD-10-CM

## 2023-09-14 DIAGNOSIS — K50012 Crohn's disease of small intestine with intestinal obstruction: Principal | ICD-10-CM

## 2023-09-21 ENCOUNTER — Encounter: Admit: 2023-09-21 | Discharge: 2023-09-25 | Payer: Medicaid (Managed Care)

## 2023-09-21 ENCOUNTER — Inpatient Hospital Stay
Admit: 2023-09-21 | Discharge: 2023-09-25 | Disposition: A | Discharge status: Home / Self Care | Payer: Medicaid (Managed Care)

## 2023-09-21 ENCOUNTER — Encounter
Admit: 2023-09-21 | Discharge: 2023-09-25 | Disposition: A | Discharge status: Home / Self Care | Payer: Medicaid (Managed Care)

## 2023-09-25 MED ORDER — ACETAMINOPHEN 500 MG TABLET
ORAL_TABLET | Freq: Four times a day (QID) | ORAL | 0 refills | 4.00000 days | Status: CP | PRN
Start: 2023-09-25 — End: ?

## 2023-09-25 MED ORDER — ENOXAPARIN 40 MG/0.4 ML SUBCUTANEOUS SYRINGE
SUBCUTANEOUS | 0 refills | 25.00000 days | Status: CP
Start: 2023-09-25 — End: 2023-10-20

## 2023-09-25 MED ORDER — ONDANSETRON 4 MG DISINTEGRATING TABLET
ORAL_TABLET | Freq: Three times a day (TID) | ORAL | 0 refills | 7.00000 days | Status: CP | PRN
Start: 2023-09-25 — End: 2023-10-02

## 2023-09-25 MED ORDER — OXYCODONE 5 MG TABLET
ORAL_TABLET | Freq: Three times a day (TID) | ORAL | 0 refills | 3.00000 days | Status: CP | PRN
Start: 2023-09-25 — End: ?

## 2023-09-29 DIAGNOSIS — M79671 Pain in right foot: Principal | ICD-10-CM

## 2023-09-29 DIAGNOSIS — Z09 Encounter for follow-up examination after completed treatment for conditions other than malignant neoplasm: Principal | ICD-10-CM

## 2023-09-29 DIAGNOSIS — R0981 Nasal congestion: Principal | ICD-10-CM

## 2023-10-04 ENCOUNTER — Inpatient Hospital Stay
Admit: 2023-10-04 | Discharge: 2023-10-09 | Disposition: A | Discharge status: Home / Self Care | Payer: Medicaid (Managed Care)

## 2023-10-04 ENCOUNTER — Ambulatory Visit: Admit: 2023-10-04 | Discharge: 2023-10-09 | Payer: Medicaid (Managed Care)

## 2023-10-04 ENCOUNTER — Encounter: Admit: 2023-10-04 | Discharge: 2023-10-09 | Payer: Medicaid (Managed Care)

## 2023-10-04 ENCOUNTER — Encounter: Admit: 2023-10-04 | Discharge: 2023-10-09 | Payer: Medicaid (Managed Care) | Attending: Family

## 2023-10-04 ENCOUNTER — Ambulatory Visit
Admit: 2023-10-04 | Discharge: 2023-10-09 | Disposition: A | Discharge status: Home / Self Care | Payer: Medicaid (Managed Care)

## 2023-10-09 MED ORDER — FLUCONAZOLE 150 MG TABLET
ORAL_TABLET | Freq: Once | ORAL | 0 refills | 0.00000 days | Status: CP | PRN
Start: 2023-10-09 — End: ?

## 2023-10-09 MED ORDER — ONDANSETRON HCL 4 MG TABLET
ORAL_TABLET | Freq: Three times a day (TID) | ORAL | 0 refills | 7.00000 days | Status: CP | PRN
Start: 2023-10-09 — End: 2023-10-16

## 2023-10-09 MED ORDER — VANCOMYCIN 125 MG CAPSULE
ORAL_CAPSULE | Freq: Four times a day (QID) | ORAL | 0 refills | 11.00000 days | Status: CP
Start: 2023-10-09 — End: 2023-10-20

## 2023-10-09 MED ORDER — AMOXICILLIN 875 MG-POTASSIUM CLAVULANATE 125 MG TABLET
ORAL_TABLET | Freq: Two times a day (BID) | ORAL | 0 refills | 2.00000 days | Status: CP
Start: 2023-10-09 — End: 2023-10-11

## 2023-10-24 NOTE — Unmapped (Addendum)
 Patient ID: Jennifer Huynh, date of birth [birth year] is a 23 y.o. female.    Date: October 25, 2023    Assessment & Plan      Jennifer Huynh is a 23 y.o. female presenting for follow up  Assessment & Plan  Hospital discharge follow-up  C. difficile diarrhea  Crohn's disease of both small and large intestine without complication    (CMS-HCC)  -Admitted from   -09/21/2023 to 09/25/2023 for Cronh's disease with complication (she underwent laparoscopic ileocolic resection)  -10/04/23 to 10/09/23 for intestinal anastomotic leak, found to have C. Diff     -She was discharged home with Vancomycin , Augmentin  and Fluconazole  which she has completed    -To continue: Pantoprazole  40mg  BID       Abnormal CT scan  Pulmonary nodule  Adnexal cyst  -Incidental on CT Abdomen Pelvis With Contrast on 10/09/2023:    -LOWER CHEST: Right middle lobe nodule measuring 1.4 cm (2:7).    -KIDNEYS/URETERS: Symmetric renal enhancement.  No hydronephrosis. Nonobstructing left nephrolithiasis. No solid renal mass. Subcentimeter hypodensities, too small to further characterize and similar to prior.    -REPRODUCTIVE ORGANS: Anteverted uterus. Bilateral adnexal cysts measuring 5.5 cm on the right (2:131) and 3.4 cm on the left (2:136).     -These results were discussed in depth with Jennifer Huynh  -Of note, she does have a remote history of vaping in highschool.     -We will obtain Imaging below to further assess  Orders:    CT chest without contrast; Future    US  Endovaginal (Non-OB); Future    -We will monitor nephrolithiasis for now. If she develops symptoms then will refer to urology   -Discussed possible gynecology referral pending Pelvic US   Polyarthralgia  -she is reporting having more hip, leg, knee pain since recent hospitalization   -We did discuss potential etiologies of infectious arthritis (however discussed less likely at this time) vs transient synovitis    -E-consult was placed on 04/21/2023  -Given that she is now experiencing polyarthralgia which is a new symptom for her, we will refer for in-person evaluation to Rheumatology   -Additionally referring to PT and obtaining XR of hips and knees  -Labs below    Orders:    CBC w/ Differential    XR Hips 2 Views Bilateral With Pelvis 5 or more views; Future    XR Knee 1 or 2 Views Bilateral; Future    Sedimentation Rate    C-reactive protein    Ambulatory referral to Rheumatology; Future    Ambulatory referral to Physical Therapy; Future    Comprehensive Metabolic Panel    -Unable to take NSAIDs. Encouraged to take Tylenol  PRN but also could try different OTC topical options for pain relief   Acute idiopathic thrombocytopenic purpura    (CMS-HCC)  -repeating CBC            HEALTH  -Pap smear due in 08/2025  -Will encourage to obtain Meningococcal vaccine.  -She is not taking wellbutrin  anymore at this time.     Return in about 3 months (around 01/24/2024) for check in.    I personally spent 40 minutes face-to-face and non-face-to-face in the care of this patient, which includes all pre, intra, and post visit time on the date of service.  All documented time was specific to the E/M visit and does not include any procedures that may have been performed.      Chief Complaint:   Chief Complaint  Patient presents with    Follow-up     Ct scan, pain in hips and knees           History of Present Illness  Jennifer Huynh is a 23 year old female who presents with joint pain following recent hospitalization for C. difficile infection.    She has been experiencing severe joint pain in her hips, knees, and ankles for about a week and a half, which worsens by the end of the day, causing her to limp and have difficulty walking. The pain is less severe in the mornings. She has been taking Tylenol  for pain management. No fever, chills, or night sweats since her second discharge from the hospital.    She was hospitalized for a C. difficile infection after surgery on July 30th and was discharged after three to four days. She returned home for about a week before being readmitted for the infection. Since her second discharge, she has not experienced any fever.    A lung nodule was found on a CT scan with contrast, which was not present on a previous scan without contrast. She denies any cough or wheezing but experiences shortness of breath, particularly after eating. She has a history of vaping in high school but has not smoked for six years.    She has a history of ovarian cysts, with a recent finding of a larger cyst on imaging. She experiences painful cramping and heavy periods but no significant pelvic pain. She had a cyst rupture a few years ago.    She is not experiencing any pain or hematuria related to the kidney stones. She has not had any urinary symptoms recently.    She reports some tenderness at the site of her surgical incisions, particularly when bumped, but notes that the incisions have healed well.      Allergies:   Allergies as of 10/25/2023 - Reviewed 10/25/2023   Allergen Reaction Noted    Pistachio nut Shortness Of Breath 12/11/2013       Problem List: Problem List[1]    The following information was reviewed by members of the visit team:  Allergies - Medications -          Vitals:    10/25/23 1538 10/25/23 1541   BP: 132/89 120/83   Pulse: 115 102   Temp: 36.4 ??C (97.6 ??F)    SpO2: 100%    Weight: 98.4 kg (217 lb)    Height: 170.2 cm (5' 7)      Body mass index is 33.99 kg/m??.    Wt Readings from Last 3 Encounters:   10/25/23 98.4 kg (217 lb)   10/08/23 (!) 101 kg (222 lb 10.6 oz)   09/29/23 98.6 kg (217 lb 6.4 oz)       ROS: ROS negative unless otherwise noted in HPI.    EXAM:   Physical Exam  Constitutional:       General: She is not in acute distress.     Appearance: She is not ill-appearing, toxic-appearing or diaphoretic.   HENT:      Head: Normocephalic and atraumatic.      Nose: No congestion.      Mouth/Throat:      Mouth: Mucous membranes are moist.   Eyes:      General:         Right eye: No discharge.         Left eye: No discharge.      Extraocular Movements: Extraocular movements intact.  Pulmonary:      Effort: Pulmonary effort is normal. No respiratory distress.   Musculoskeletal:         General: Normal range of motion.   Skin:     Comments: Surgical scars healing well    Neurological:      General: No focal deficit present.      Mental Status: She is alert and oriented to person, place, and time.      Cranial Nerves: No cranial nerve deficit.   Psychiatric:         Mood and Affect: Mood normal.         Behavior: Behavior normal.                  [1]   Patient Active Problem List  Diagnosis    Crohn's disease of both small and large intestine without complication    (CMS-HCC)    Crohn's disease of both small and large intestine with rectal bleeding    (CMS-HCC)    Acute idiopathic thrombocytopenic purpura    (CMS-HCC)    Severe anxiety    Moderate major depression (CMS-HCC)    Iron  deficiency anemia due to chronic blood loss    Iron  deficiency    Vitamin D  deficiency    Nephrolithiasis    Eosinophilic esophagitis    History of ITP    Crohn's disease    (CMS-HCC)    Hypercoagulable state (HHS-HCC)    Right lower quadrant abdominal pain    Intestinal anastomotic leak    C. difficile diarrhea

## 2023-10-25 ENCOUNTER — Encounter
Admit: 2023-10-25 | Discharge: 2023-10-25 | Payer: Medicaid (Managed Care) | Attending: Student in an Organized Health Care Education/Training Program | Primary: Student in an Organized Health Care Education/Training Program

## 2023-10-25 DIAGNOSIS — M2550 Pain in unspecified joint: Principal | ICD-10-CM

## 2023-10-25 DIAGNOSIS — D693 Immune thrombocytopenic purpura: Principal | ICD-10-CM

## 2023-10-25 DIAGNOSIS — R9389 Abnormal findings on diagnostic imaging of other specified body structures: Principal | ICD-10-CM

## 2023-10-25 DIAGNOSIS — R911 Solitary pulmonary nodule: Principal | ICD-10-CM

## 2023-10-25 DIAGNOSIS — Z09 Encounter for follow-up examination after completed treatment for conditions other than malignant neoplasm: Principal | ICD-10-CM

## 2023-10-25 DIAGNOSIS — K5080 Crohn's disease of both small and large intestine without complications: Principal | ICD-10-CM

## 2023-10-25 DIAGNOSIS — A0472 Enterocolitis due to Clostridium difficile, not specified as recurrent: Principal | ICD-10-CM

## 2023-10-25 DIAGNOSIS — N949 Unspecified condition associated with female genital organs and menstrual cycle: Principal | ICD-10-CM

## 2023-10-26 ENCOUNTER — Ambulatory Visit: Admit: 2023-10-26 | Discharge: 2023-10-27 | Payer: Medicaid (Managed Care)

## 2023-10-28 NOTE — Unmapped (Signed)
 Specialty Medication(s): Zymfentra     Ms.Casady has been dis-enrolled from the [facility] specialty pharmacy services as a result of waiting on a change in therapy.     Additional information provided to the patient: n/a    Shenee Wignall A Eun Vermeer, PharmD  [facility] Specialty Pharmacist

## 2023-10-28 NOTE — Unmapped (Signed)
 Jennifer Huynh has been contacted in regards to their refill of ZYMFENTRA  120 mg/mL Pnkt (inFLIXimab -dyyb). At this time, they have declined refill due to patient waiting on therapy change,she stated she has an appointment this week for the provider to change medication.

## 2023-10-31 ENCOUNTER — Encounter: Admit: 2023-10-31 | Discharge: 2023-11-01 | Payer: Medicaid (Managed Care)

## 2023-11-03 DIAGNOSIS — K5080 Crohn's disease of both small and large intestine without complications: Principal | ICD-10-CM

## 2023-11-03 MED ORDER — SKYRIZI 360 MG/2.4 ML (150 MG/ML) SUBCUTANEOUS WEARABLE INJECTOR
SUBCUTANEOUS | 2 refills | 0.00000 days | Status: CP
Start: 2023-11-03 — End: ?
  Filled 2024-01-13: qty 2.4, 56d supply, fill #0

## 2023-11-04 NOTE — Unmapped (Signed)
 [facility] Specialty Medication Onboarding    Specialty Medication: Skyrizi   Prior Authorization: Approved   Financial Assistance: No - copay  <$25  Final Copay/Day Supply: $4 / 56 days    Insurance Restrictions: None     Notes to Pharmacist: Received first IV infusion 9/8, due for first OBI ~12/3  Credit Card on File: yes  Start Date on Rx:      The triage team has completed the benefits investigation and has determined that the patient is able to fill this medication at [facility]. Please contact the patient to complete the onboarding or follow up with the prescribing physician as needed.

## 2023-11-07 ENCOUNTER — Inpatient Hospital Stay: Admit: 2023-11-07 | Discharge: 2023-11-07 | Payer: Medicaid (Managed Care)

## 2023-11-08 NOTE — Unmapped (Signed)
 Infusions scheduled:    Skyrizi  Q8W approved for $4 copay.   Skyrizi  requires three IV inductions doses completed at weeks 0, 4, and 8 with the maintenance subcutaneous dose starting at week 12.   Jennifer Huynh completed 1st IV induction dose on 10/31/2023. The other induction IV doses are scheduled for 11/30/2023 and ~12/28/2023.     Patient outreach is scheduled the week of November 17th which is 2 weeks before first subcutaneous dose is due on ~01/25/2024.  Sent patient Mychart message with link to video and communicated outreach.       [name], PharmD   [facility]  [address], [place], [place] [address]  Phone: [phone] - Fax. [phone]

## 2023-11-09 DIAGNOSIS — J180 Bronchopneumonia, unspecified organism: Principal | ICD-10-CM

## 2023-11-09 DIAGNOSIS — R9389 Abnormal findings on diagnostic imaging of other specified body structures: Principal | ICD-10-CM

## 2023-11-09 DIAGNOSIS — R911 Solitary pulmonary nodule: Principal | ICD-10-CM

## 2023-11-09 DIAGNOSIS — R0600 Dyspnea, unspecified: Principal | ICD-10-CM

## 2023-11-09 MED ORDER — LEVOFLOXACIN 750 MG TABLET
ORAL_TABLET | Freq: Every day | ORAL | 0 refills | 5.00000 days | Status: CP
Start: 2023-11-09 — End: 2023-11-14

## 2023-11-09 MED ORDER — ALBUTEROL SULFATE HFA 90 MCG/ACTUATION AEROSOL INHALER
Freq: Four times a day (QID) | RESPIRATORY_TRACT | 0 refills | 0.00000 days | Status: CP | PRN
Start: 2023-11-09 — End: 2024-11-08

## 2023-11-11 DIAGNOSIS — K5080 Crohn's disease of both small and large intestine without complications: Principal | ICD-10-CM

## 2023-11-11 MED ORDER — BUDESONIDE DR - ER 3 MG CAPSULE,DELAYED,EXTENDED RELEASE
ORAL_CAPSULE | Freq: Every morning | ORAL | 0 refills | 90.00000 days | Status: CP
Start: 2023-11-11 — End: 2024-02-09

## 2023-11-11 NOTE — Unmapped (Signed)
 Telephone call to patient regarding her concerns on TB testing after a nodule was found on her lung with CT imaging done for her surgical resection. She had one done 08/21/23 negative along with 11/06/2021 also negative. She also had a CT chest done 11/07/2023 that noted bronchopneumonia and has been prescribed Levaquin  which we are in agreement with.     She has also been having joint pains that have really flared ~ 10/25/23 but has not been taking anything for this. From what she tells me, she was discharged from the hospital with plans to switch her therapy to Skyrizi  which was postponed 2 weeks due to the surgery. Her first infusion was 10/31/2023 which she had improvement of her joint pain. It is still present (achy) but tolerable. She will be getting some blood work for rheumatology appt upcoming in the next 2 weeks including ANA, ANCA, complement and RF.     She is still having blood in my stool, diarrhea, and increased bowel urgency. It varies from once per day (today) to 3-4 times daily but continues with blood and mucous. Will start her on topical budesonide  (Entecort) as a bridge for her Skyrizi  until she is therapeutic. She will keep us  updated and is in agreement with the plan.      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 In the respect of needing a bathroom visit during an exam to grace with absence due to disease related issues which we will send her through My Chart.

## 2023-11-14 DIAGNOSIS — J180 Bronchopneumonia, unspecified organism: Principal | ICD-10-CM

## 2023-11-14 MED ORDER — DOXYCYCLINE HYCLATE 100 MG CAPSULE
ORAL_CAPSULE | Freq: Two times a day (BID) | ORAL | 0 refills | 7.00000 days | Status: CP
Start: 2023-11-14 — End: 2023-11-21

## 2023-11-14 MED ORDER — FLUCONAZOLE 150 MG TABLET
ORAL_TABLET | ORAL | 0 refills | 0.00000 days | Status: CP
Start: 2023-11-14 — End: ?

## 2023-11-14 MED ORDER — AMOXICILLIN 875 MG-POTASSIUM CLAVULANATE 125 MG TABLET
ORAL_TABLET | Freq: Two times a day (BID) | ORAL | 0 refills | 7.00000 days | Status: CP
Start: 2023-11-14 — End: 2023-11-21

## 2023-11-14 NOTE — Unmapped (Signed)
 Addended by: [name] CRUMBLY PAP on: 11/14/2023 09:27 AM     Modules accepted: Orders

## 2023-11-21 NOTE — Unmapped (Signed)
  GASTROENTEROLOGY  INFLAMMATORY BOWEL DISEASE  11/21/2023    Demographics:  Jennifer Huynh is a 23 y.o. year old female    Referring physician:   Pcp, None Per Patient  [address]  [place],  [place] [address]          HPI / NOTE :     Today, I saw Jennifer Huynh for follow up consultation in the [facility] at the request of Dr. Emmit Limes regarding Crohn's Disease.  Outside records including available clinical notes, endoscopy reports, imaging results and pathology results were reviewed in detail as part of this follow up visit.    HPI:  Jennifer Huynh is a 23 y.o. woman with history of ileocolonic Crohn's disease, s/p resection 08/2023, who was recently started on Skyrizi  postoperatively.   History of Present Illness  Since she was last seen (08/2023), she has had her initial ileocolonic resection on 09/21/23 and 2nd hospitalization for intestinal anastomotic leak and C Diff infection. She was treated with IV antibiotics and supportive measures. She also developed chest pain and SOB 2 weeks later that was positive for infectious bronchopneumonia.     Currently, she experiences fluctuating bowel habits characterized by frequent presence of blood and mucus in her stool. Bowel movements range from solid to diarrhea, with more episodes being mucousy. She experiences urgency and occasional episodes of loose stools, particularly at night, with a frequency ranging from 2-6 times a day. Minimal abdominal pain is noted, which worsens with episodes of diarrhea but eases after bowel movements. No significant nausea is reported, and she has not used Zofran  recently. Her diet is stable, and she has gained some weight post-surgery. She sleeps well and does not wake up for bowel movements.    Joint pain has improved and is now localized to her hips, occurring only with certain movements. She is not taking any medication for the joint pain and is currently on Skyrizi , having completed one infusion with the next scheduled for October 8th. She will be getting rheumatologic labs with PCP today.     She has a history of C. diff infection and recent surgery, which has left her feeling chronically fatigued. 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    Otherwise, the balance of 10 systems is negative.          IBD HISTORY:     Year of disease onset:  2018  Diagnosis:  Crohn's Disease  Age at onset:   < 32 yr old (A1)  Location:  Ileal and colonic  Behavior:  Nonstricturing,nonpenetrating (B1)  Perianal Dz:  No    Brief IBD Disease Course:      04/2016 presented with blood in stools and abdominal pain, diagnosed with terminal ileum and right sided Crohn's disease. Started steroids.  05/2016 start methotrexate 15 mg weekly to wean her off Entocort  06/2016 drop MTX to 10 mg weekly  07/2016 increase MTX to 15 mg weekly and restart Budesonide  due to return of symptoms  09/2016 start Amitriptyline  10 mg HS for functional diarrhea, nausea  09/2016 started Humira  for active disease on MTX (stopped)  06/2017 hospital admission for flare, developed antibodies to Humira , started Infliximab  10 mg/kg every 8 weeks and steroid taper  08/2017 started Amitriptyline  25 mg HS  11/2017 return of symptoms, steroid taper started  08/2018 continue Infliximab  10 mg/kg every 8 weeks  08/2019 hospital admission, flare of symptoms, IV  steroids  11/2019 diagnosed with Mono  05/2020 increased Infliximab  to 20 mg/kg every 8 weeks (low trough level)  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, treated with IVIG and steroids  04/2022 Infliximab  being held; checking antigens  05/2022 Infliximab  20 mg/kg resumed  08/2022 transitioned to Infliximab  biosimilar 120 mg injections every 2 weeks  06/2023 initial visit at [facility]; in clinical remission  07/2023 hospital admission for Crohn's flare; colonoscopy with strictured ileum non-traversable, discharged on steroid taper  08/2023 stop Infliximab ; will proceed with ileocolonic resection, starting Skyrizi  postoperatively  09/2023 hospital admission for postoperative infection  10/2023 start of Skyrizi       Endoscopy:        07/2023 Colonoscopy  Impression: Unable to be traversed.                         - Non-traversable stricture in the ileum, 3 cm from the ileocecal   valve. Ileum is inflamed but not ulcerated.                         - Patchy mild inflammation was found in the rectum and in the   cecum secondary to Crohn's disease.                         - Simple Endoscopic Score for Crohn's Disease: 13, mucosal   inflammatory changes secondary to Crohn's disease, with ileitis and   colitis.                         - The distal rectum and anal verge are normal on retroflexion view.    07/2023 EGD  Impression: - Esophageal mucosal changes suggestive of eosinophilic   esophagitis. Biopsied.                          - Papule in the esophagus. Biopsied.                          - Normal stomach.                          - Normal examined duodenum.    09/2021 Colonoscopy  Impression: - Perianal skin tags found on perianal exam.                         - The entire examined colon is normal. Biopsied.                         - The examined portion of the ileum was normal.    PATH Terminal ileum, mild chronic active ileitis    09/2021 EGD  Impression: PATH eosinophilic esophagitis              Superficial chronic gastritis    09/2016 Colonoscopy  Impression: - Inflamed mucosa in the mid rectum. Biopsied.    - Inflamed mucosa in the cecum. Biopsied.                      - Crohn's disease, with ileitis and colitis. Biopsied.    PATH Terminal ileum, moderate chronic active ileitis              Right, transverse and left  colon, mild chronic active colitis    09/2016 EGD  Impression: - LA Grade A non-reflux esophagitis. Biopsied.                      - Normal stomach. Biopsied.                      - Normal examined duodenum. Biopsied.    04/2016 Colonoscopy  Impression: - Perianal skin tag found on perianal exam. Aptheous ulceration   predominately rectum. Few transverse colon                      - Right colon congested, friable with pseudopolyps suspicion for IBD    PATH right colon, severely active chronic colitis              Transverse, left colon and rectum with mildly active chronic             colitis    04/2016 EGD  Impression: - Normal esophagus. Biopsied.                      - Normal stomach. Biopsied. - Chronic superficial gastritis                      - Normal examined duodenum. Biopsied.    Imaging:      07/2023 CT A/P  Impression: - Mild stranding and thickening of the terminal ileum, with associated   fecalization of small bowel suggestive of delayed transit. Findings   may reflective acute Crohn's flare. No abscess identified. No   visualized fistula or definitive stricturing on this limited study (without   neutral enteric contrast.)   - Nonobstructive punctate left renal calculi.     07/2018 CT A/P  Impression: - No evidence of perirectal fistula or perirectal abscess as clinically   questioned.   - Minimal inflammatory stranding about the cecum with a few mildly  prominent but subcentimeter lymph nodes, likely reactive.  These   inflammatory changes are decreased from prior.   - Patulous terminal ileum, increased in diameter compared with   prior.     05/2016 MRE  Impression: - Inflammatory changes involving the terminal ileum and ascending   colon, consistent with active Crohn's disease    Prior IBD medications (type, dose, duration, response):    Methotrexate - lost response  Amitriptyline  - weight gain  Adalimumab  - developed antibodies  Infliximab  - lost response due to suboptimal SQ dosing            Past Medical History:   Past medical history:   Past Medical History:   Diagnosis Date    Anemia     Anxiety     Clotting disorder (HHS-HCC)     Crohn disease    (CMS-HCC)     History of idiopathic thrombocytopenic purpura     Obesity     Shingles     Vitamin D  deficiency      Past surgical history:   Past Surgical History[1]    Family history:   Family History   Problem Relation Age of Onset    Alcohol abuse Father     Depression Father     Drug abuse Father     Heart disease Father     Hypertension Father     Mental illness Father     Clotting disorder Father     Aneurysm Paternal Grandfather  Asthma Sister     Colorectal Cancer Neg Hx     Autoimmune disease Neg Hx     Inflammatory bowel disease Neg Hx      Social history:   Social History[2]  going to [employer] for her associates in nursing          Allergies:     Allergies   Allergen Reactions    Pistachio Nut Shortness Of Breath             Medications:     Current Outpatient Medications   Medication Sig Dispense Refill    acetaminophen  (TYLENOL ) 500 MG tablet Take 2 tablets (1,000 mg total) by mouth every six (6) hours as needed for pain. 30 tablet 0    albuterol  HFA 90 mcg/actuation inhaler Inhale 2 puffs every six (6) hours as needed for wheezing or shortness of breath. 18 g 0    amoxicillin -clavulanate (AUGMENTIN ) 875-125 mg per tablet Take 1 tablet by mouth two (2) times a day for 7 days. 14 tablet 0    budesonide  (ENTOCORT EC ) 3 mg 24 hr capsule Take 3 capsules (9 mg total) by mouth every morning. 270 capsule 0    doxycycline  (VIBRAMYCIN ) 100 MG capsule Take 1 capsule (100 mg total) by mouth two (2) times a day for 7 days. 14 capsule 0    empty container Misc Use 1 each 3    fluconazole  (DIFLUCAN ) 150 MG tablet Take one tablet for vaginal discharge. You may repeat in 72 hours if your symptoms continue to persist 2 tablet 0    [Paused] inFLIXimab -dyyb 120 mg/mL PnKt Inject 120 mg under the skin every fourteen (14) days. (Patient not taking: Reported on 10/25/2023) 2 kit 5    risankizumab -rzaa (SKYRIZI ) 360 mg/2.4 mL (150 mg/mL) Injt Inject the contents of 1 cartridge (360mg ) under the skin every 8 weeks 2.4 mL 2     No current facility-administered medications for this visit.             Physical Exam:   LMP 11/07/2023     Wt Readings from Last 3 Encounters:   10/31/23 98.7 kg (217 lb 8 oz)   10/26/23 99.2 kg (218 lb 12.8 oz)   10/25/23 98.4 kg (217 lb)     GEN: no apparent distress, appears comfortable on exam  HEENT: PEERL, anicteric, mucous membranes moist  NEURO:  gait normal, non-focal, no obvious neurologic abnormality  NECK: Supple, no lymphadenopathy  LUNGS: CTAB, no wheezes, rales, or rhonchi  CV: S1/S2, RRR, no murmurs  ABD: Soft, TTP RLQ, nondistended, normoactive bowel sounds, no rebound/guarding, no appreciable organomegaly  Extremities: no cyanosis, clubbing or edema, normal gait  Psych: affect appropriate, A&O x3  SKIN: no visible lesions on face, neck, arms, abdomen  PERIANAL / RECTAL EXAM:   Deferred           Labs, Data & Indices:     Lab Review:   Lab Results   Component Value Date    WBC 9.8 10/25/2023    WBC 8.8 12/29/2022    RBC 4.35 10/25/2023    RBC 4.66 12/29/2022    HGB 12.6 10/25/2023    HGB 13.6 12/29/2022     Lab Results   Component Value Date    AST 19 10/25/2023    AST 9 03/28/2017    ALT 18 10/25/2023    ALT 9 03/28/2017    BUN 8 (L) 10/25/2023    BUN 6 03/28/2017    Creatinine 0.71 10/25/2023  Creatinine 0.71 03/28/2017    CO2 24.0 10/25/2023    CO2 24 03/28/2017    Albumin 3.8 10/25/2023    Calcium 9.8 10/25/2023    Calcium 9.5 03/28/2017     Lab Results   Component Value Date    TSH 1.452 04/19/2023      Lab Results   Component Value Date    Infliximab  9.1 08/21/2023    Infliximab  16 07/04/2023    Infliximab  14 07/12/2022    Infliximab  2.7 (L) 05/18/2022    Infliximab  5.9 05/04/2022    Infliximab  15 05/19/2021    Infliximab  17 09/29/2020    Infliximab  4.7 (L) 06/09/2020    Infliximab  6.0 01/16/2020    Infliximab  6.3 11/21/2019    Anti-Infliximab  Ab <20.0 05/18/2022    Anti-Infliximab  Ab <20.0 06/09/2020    Anti-Infliximab  Ab <20.0 09/19/2019     Lab Results   Component Value Date    Adalimumab  2.7 (L) 07/10/2017    Adalimumab  14.4 12/14/2016    Anti-Adalimumab  Ab 18.7 (H) 07/10/2017              Assessment & Recommendations:     Jennifer Huynh is a 23 y.o. female who presents for hospital follow up of her ileal Crohn's disease flare. She was transitioned from high dose infliximab  infusions every 8 weeks to subcutaneous infliximab  10/2022 (which is roughly considered therapeutically equivalent to 5mg /kg q8 weeks), likely representing underdosing and poor disease coverage.     Crohn's disease: notes some improvement of symptoms with first Skyrizi  infusion. Has not started Budesonide  9 mg daily for bridging therapy as she has been unable to pickup from pharmacy. Restaging colonoscopy in 6-9 months. Will get Calprotectin level with scope to get a quantitative predictor.     Abdominal pain: mild that resolves with BM. Will start Budesonide  9 mg daily now and continue to bridge Skyrizi  for 2-3 months.    C Diff: found during 09/2023 hospitalization. Vancomycin  therapy completed. PCP to recheck C Diff today.    Joint pain: started 09/2023 and probably correlates with control of Crohn's disease. Has been off therapy since mid June 2025. Notes improvement after 1 infusion of Skyrizi . Will be getting Rheumatologic labs today for workup by PCP.    Iron  Deficiency: last labs 07/2023 reviewed noting iron  (32), Tsat (9%), ferritin (10.4). She received 1 Infed infusion while inpatient 07/2023. Her energy was slightly improved at that time. Will recheck today and continue Infed IV supplementation if needed.     Reflux: continue Protonix  40 mg BID. If there is break through symptoms, can use over the counter Gaviscon. Recent EGD results with improvement of eosinophils, up to 6 eosinophils/high-power field.     Abnormal CT scan: noting evolving nodular consolidation in RML, favoring infectious bronchopneumonia. Pt opted to not take abx due to lack of clinical symptoms. Will repeat CT Chest December 2025.     Labs: last labs 10/2023 reviewed with CRP (40.7). Will recheck today. Continue safety monitoring every 6 months.    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 Has appt with Hematology 03/2024.    Health Prevention: recommend annual flu, COVID booster and 2nd Shingrix vaccines; PCV 04 Nov 2001, PCV 05 Dec 2002, PCV 12 Jun 2023, Shingles 06/2023 (needs 2nd shot), Tdap 09/2022, Covid 11/2019 & 04/2020. Continue annual physicals and PAP smears.      PLAN:    Continue Skyrizi  induction with 2nd infusion 11/30/2023  Start Budesonide  9 mg (3  capsules) daily now to bridge Skyrizi    Obtain CBC, CMP, CRP and iron  labs today  Follow up with Dr. Jenita in 2 months.  Restaging colonoscopy in 6-9 months  Continue Protonix  40 mg twice daily for reflux  Prevention: recommend annual flu, COVID booster and 2nd Shingrix vaccines; up to date currently on pneumonia vaccines. Continue annual physicals and PAP smears.   Return in 6 months or sooner as needed        Jennifer Denetta Fei, NP  Advanced Practice Provider, IBD  [facility] Gastroenterology & Hepatology  Multidisciplinary Inflammatory Bowel Disease Clinic     I personally spent 39 minutes face-to-face and non-face-to-face in the care of this patient, which includes all pre, intra, and post visit time on the date of service.  All documented time was specific to the E/M visit and does not include any procedures that may have been performed.       [1]   Past Surgical History:  Procedure Laterality Date    PR COLONOSCOPY FLX DX W/COLLJ SPEC WHEN PFRMD N/A 08/22/2023    Procedure: COLONOSCOPY, FLEXIBLE, PROXIMAL TO SPLENIC FLEXURE; DIAGNOSTIC, W/WO COLLECTION SPECIMEN BY BRUSH OR WASH;  Surgeon: Mariann Norleen Mt, MD;  Location: GI PROCEDURES [facility];  Service: Gastroenterology    PR COLONOSCOPY W/BIOPSY SINGLE/MULTIPLE N/A 05/13/2016    Procedure: COLONOSCOPY, FLEXIBLE, PROXIMAL TO SPLENIC FLEXURE; WITH BIOPSY, SINGLE OR MULTIPLE;  Surgeon: Annalee Dine Mir, MD;  Location: PEDS PROCEDURE ROOM [facility];  Service: Gastroenterology    PR COLONOSCOPY W/BIOPSY SINGLE/MULTIPLE N/A 10/14/2016 Procedure: COLONOSCOPY, FLEXIBLE, PROXIMAL TO SPLENIC FLEXURE; WITH BIOPSY, SINGLE OR MULTIPLE;  Surgeon: Eric Lenell Eland, MD;  Location: PEDS PROCEDURE ROOM [facility];  Service: Gastroenterology    PR COLONOSCOPY W/BIOPSY SINGLE/MULTIPLE N/A 10/08/2021    Procedure: COLONOSCOPY, FLEXIBLE, PROXIMAL TO SPLENIC FLEXURE; WITH BIOPSY, SINGLE OR MULTIPLE;  Surgeon: Eric Lenell Eland, MD;  Location: PEDS PROCEDURE ROOM [facility];  Service: Gastroenterology    PR LAP,SURG,COLECTOMY,W/REMVL TERM ILEUM N/A 09/21/2023    Procedure: RIGHT LAPAROSCOPY, SURGICAL; COLECTOMY, PARTIAL, WITH REMOVAL OF TERMINAL ILEUM WITH ILEOCOLOSTOMY;  Surgeon: Miguel Fara Kocher, MD;  Location: OR [facility];  Service: Gastrointestinal    PR UPPER GI ENDOSCOPY,BIOPSY N/A 05/13/2016    Procedure: UGI ENDOSCOPY; WITH BIOPSY, SINGLE OR MULTIPLE;  Surgeon: Annalee Dine Mir, MD;  Location: PEDS PROCEDURE ROOM [facility];  Service: Gastroenterology    PR UPPER GI ENDOSCOPY,BIOPSY N/A 10/14/2016    Procedure: UGI ENDOSCOPY; WITH BIOPSY, SINGLE OR MULTIPLE;  Surgeon: Eric Lenell Eland, MD;  Location: PEDS PROCEDURE ROOM [facility];  Service: Gastroenterology    PR UPPER GI ENDOSCOPY,BIOPSY N/A 10/08/2021    Procedure: UGI ENDOSCOPY; WITH BIOPSY, SINGLE OR MULTIPLE;  Surgeon: Eric Lenell Eland, MD;  Location: PEDS PROCEDURE ROOM [facility];  Service: Gastroenterology    PR UPPER GI ENDOSCOPY,BIOPSY N/A 08/22/2023    Procedure: UGI ENDOSCOPY; WITH BIOPSY, SINGLE OR MULTIPLE;  Surgeon: Mariann Norleen Mt, MD;  Location: GI PROC[facility]URES [facility];  Service: Gastroenterology   [2]   Social History  Socioeconomic History    Marital status: Single    Number of children: 0    Highest education level: Some college, no degree   Tobacco Use    Smoking status: Former     Types: e-Cigarettes     Passive exposure: Current    Smokeless tobacco: Never   Vaping Use    Vaping status: Former   Substance and Sexual Activity    Alcohol use: Not Currently    Drug use: Never  Social Drivers of Psychologist, prison and probation services Strain: Low Risk  (08/21/2023)    Overall Financial Resource Strain (CARDIA)     Difficulty of Paying Living Expenses: Not very hard   Food Insecurity: No Food Insecurity (05/26/2023)    Hunger Vital Sign     Worried About Running Out of Food in the Last Year: Never true     Ran Out of Food in the Last Year: Never true   Transportation Needs: No Transportation Needs (05/26/2023)    PRAPARE - Therapist, art (Medical): No     Lack of Transportation (Non-Medical): No    Received from [facility] Short Social Needs Screening - Social Connection   Housing: Low Risk  (05/26/2023)    Housing     Within the past 12 months, have you ever stayed: outside, in a car, in a tent, in an overnight shelter, or temporarily in someone else's home (i.e. couch-surfing)?: No     Are you worried about losing your housing?: No

## 2023-11-22 ENCOUNTER — Encounter
Admit: 2023-11-22 | Discharge: 2023-11-23 | Payer: Medicaid (Managed Care) | Attending: Student in an Organized Health Care Education/Training Program | Primary: Student in an Organized Health Care Education/Training Program

## 2023-11-22 ENCOUNTER — Ambulatory Visit: Admit: 2023-11-22 | Discharge: 2023-11-23 | Payer: Medicaid (Managed Care)

## 2023-11-22 DIAGNOSIS — M2550 Pain in unspecified joint: Principal | ICD-10-CM

## 2023-11-22 DIAGNOSIS — K5080 Crohn's disease of both small and large intestine without complications: Principal | ICD-10-CM

## 2023-11-22 DIAGNOSIS — R911 Solitary pulmonary nodule: Principal | ICD-10-CM

## 2023-11-22 DIAGNOSIS — E611 Iron deficiency: Principal | ICD-10-CM

## 2023-11-22 DIAGNOSIS — R9389 Abnormal findings on diagnostic imaging of other specified body structures: Principal | ICD-10-CM

## 2023-11-22 DIAGNOSIS — Z23 Encounter for immunization: Principal | ICD-10-CM

## 2023-11-22 DIAGNOSIS — H43393 Other vitreous opacities, bilateral: Principal | ICD-10-CM

## 2023-11-22 DIAGNOSIS — D693 Immune thrombocytopenic purpura: Principal | ICD-10-CM

## 2023-11-22 DIAGNOSIS — A0472 Enterocolitis due to Clostridium difficile, not specified as recurrent: Principal | ICD-10-CM

## 2023-11-22 DIAGNOSIS — K219 Gastro-esophageal reflux disease without esophagitis: Principal | ICD-10-CM

## 2023-11-22 DIAGNOSIS — R002 Palpitations: Principal | ICD-10-CM

## 2023-11-22 DIAGNOSIS — R197 Diarrhea, unspecified: Principal | ICD-10-CM

## 2023-11-22 LAB — PROTEIN / CREATININE RATIO, URINE
CREATININE, URINE: 55.9 mg/dL
PROTEIN URINE: <6 mg/dL

## 2023-11-22 LAB — COMPREHENSIVE METABOLIC PANEL
ALBUMIN: 3.7 g/dL (ref 3.4–5.0)
ALKALINE PHOSPHATASE: 75 U/L (ref 46–116)
ALT (SGPT): 20 U/L (ref 10–49)
ANION GAP: 11 mmol/L (ref 5–14)
AST (SGOT): 16 U/L (ref <=34)
BILIRUBIN TOTAL: 0.3 mg/dL (ref 0.3–1.2)
BLOOD UREA NITROGEN: 10 mg/dL (ref 9–23)
BUN / CREAT RATIO: 14
CALCIUM: 9.5 mg/dL (ref 8.7–10.4)
CHLORIDE: 102 mmol/L (ref 98–107)
CO2: 26 mmol/L (ref 20.0–31.0)
CREATININE: 0.72 mg/dL (ref 0.55–1.02)
EGFR CKD-EPI (2021) FEMALE: >90 mL/min/1.73m2 (ref >=60)
GLUCOSE RANDOM: 84 mg/dL (ref 70–179)
POTASSIUM: 4.3 mmol/L (ref 3.5–5.1)
PROTEIN TOTAL: 8 g/dL (ref 5.7–8.2)
SODIUM: 139 mmol/L (ref 135–145)

## 2023-11-22 LAB — C-REACTIVE PROTEIN: C-REACTIVE PROTEIN: 12.9 mg/L — ABNORMAL HIGH (ref <=10.0)

## 2023-11-22 LAB — CBC W/ AUTO DIFF
BASOPHILS ABSOLUTE COUNT: 0.1 10*9/L (ref 0.0–0.1)
BASOPHILS RELATIVE PERCENT: 0.8 %
EOSINOPHILS ABSOLUTE COUNT: 0.5 10*9/L (ref 0.0–0.5)
EOSINOPHILS RELATIVE PERCENT: 6.6 %
HEMATOCRIT: 36.8 % (ref 34.0–44.0)
HEMOGLOBIN: 12.9 g/dL (ref 11.3–14.9)
LYMPHOCYTES ABSOLUTE COUNT: 2.2 10*9/L (ref 1.1–3.6)
LYMPHOCYTES RELATIVE PERCENT: 31 %
MEAN CORPUSCULAR HEMOGLOBIN CONC: 35.2 g/dL (ref 32.0–36.0)
MEAN CORPUSCULAR HEMOGLOBIN: 28.6 pg (ref 25.9–32.4)
MEAN CORPUSCULAR VOLUME: 81.3 fL (ref 77.6–95.7)
MEAN PLATELET VOLUME: 7.8 fL (ref 6.8–10.7)
MONOCYTES ABSOLUTE COUNT: 0.5 10*9/L (ref 0.3–0.8)
MONOCYTES RELATIVE PERCENT: 7.1 %
NEUTROPHILS ABSOLUTE COUNT: 3.9 10*9/L (ref 1.8–7.8)
NEUTROPHILS RELATIVE PERCENT: 54.5 %
PLATELET COUNT: 297 10*9/L (ref 150–450)
RED BLOOD CELL COUNT: 4.52 10*12/L (ref 3.95–5.13)
RED CELL DISTRIBUTION WIDTH: 14.9 % (ref 12.2–15.2)
WBC ADJUSTED: 7.2 10*9/L (ref 3.6–11.2)

## 2023-11-22 LAB — SEDIMENTATION RATE: ERYTHROCYTE SEDIMENTATION RATE: 56 mm/h — ABNORMAL HIGH (ref 0–20)

## 2023-11-22 LAB — IRON & TIBC
IRON SATURATION: 14 % — ABNORMAL LOW (ref 20–55)
IRON: 41 ug/dL — ABNORMAL LOW (ref 50–170)
TOTAL IRON BINDING CAPACITY: 292 ug/dL (ref 250–425)

## 2023-11-22 LAB — FERRITIN: FERRITIN: 35.9 ng/mL (ref 7.3–270.7)

## 2023-11-22 LAB — C4 COMPLEMENT: C4 COMPLEMENT: 23.2 mg/dL (ref 12.0–36.0)

## 2023-11-22 NOTE — Unmapped (Signed)
 Zio Patch monitor explained; instructions given, including to call [employer] for any difficulties; questions answered; skin prepped; monitor placed; pt initiated; registered with [employer]. Pt advised to call [employer] for problems @ [phone].  Pt verbalized understanding and does not have any further questions at this time.  Leighton Dage, CMA

## 2023-11-22 NOTE — Unmapped (Addendum)
-  recently had C. Diff infection during admission in August 2025.   -She was able to see GI today and reports that she is still having blood in her stool and still having loose stools  -Labs ordered below by GI  -We discussed repeating stool studies which she would like to do. However we do not have the proper equipment for the GI PATHOGEN PANEL given recent change  -She would like retesting for C. Diff. Order placed    Orders:    C. Difficile Assay; Future    CBC w/ Differential    Comprehensive Metabolic Panel    Ferritin    Iron  & TIBC

## 2023-11-22 NOTE — Unmapped (Addendum)
 Continue Skyrizi  induction with 2nd infusion 11/30/2023  Start Budesonide  9 mg (3 capsules) daily now to bridge Skyrizi    Obtain CBC, CMP, CRP and iron  labs today  Follow up with Dr. Jenita in 2 months.  Restaging colonoscopy in 6-9 months  Continue Protonix  40 mg twice daily for reflux  Prevention: recommend annual flu, COVID booster and 2nd Shingrix vaccines; up to date currently on pneumonia vaccines. Continue annual physicals and PAP smears.   Return in 6 months or sooner as needed

## 2023-11-22 NOTE — Unmapped (Signed)
-  Visit scheduled with Hematology on 04/02/2024

## 2023-11-22 NOTE — Unmapped (Signed)
 Patient ID: Jennifer Huynh, date of birth 06-14-2000 is a 23 y.o. female.    Date: November 22, 2023    Assessment & Plan      Jennifer Huynh is a 23 y.o. female presenting for follow up  -she was recently seen on 10/25/2023    Assessment & Plan  C. difficile diarrhea  Diarrhea, unspecified type  Crohn's disease of both small and large intestine without complication    (CMS-HCC)  Iron  deficiency  -recently had C. Diff infection during admission in August 2025.   -She was able to see GI today and reports that she is still having blood in her stool and still having loose stools  -Labs ordered below by GI  -We discussed repeating stool studies which she would like to do. However we do not have the proper equipment for the GI PATHOGEN PANEL given recent change  -She would like retesting for C. Diff. Order placed    Orders:    C. Difficile Assay; Future    CBC w/ Differential    Comprehensive Metabolic Panel    Ferritin    Iron  & TIBC    Vitreous floaters of both eyes  -she reports that since leaving the hospital, she has had more floaters in her vision. She denies eye pain, loss of vision or other changes  -Will refer to Ophthalmology for evaluation   Orders:    Ambulatory referral to Ophthalmology; Future    Palpitations  -she reports chronic palpitations.   -EKG from recent hospitalization was reviewed. 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 diff infection    -Obtaining labs below for Rheumatology   Orders:    Quantiferon TB Gold Plus    Anti-Nuclear Antibody (ANA)    Extractable Nuclear Antigen    C3 complement    C4 complement    Anti-neutrophilic Cytoplasmic Antibody (ANCA)    Sedimentation Rate    C-reactive protein    Protein/Creatinine Ratio, Urine    -Given GI history, she needs to avoid PO NSAIDs    Immunization due  -Received HIGH DOSE Flu vaccine due to immunocompromised state  Orders:    INFLUENZA ADJUVANTED PF, IIV3(79YR UP)(FLUAD)    Abnormal CT scan  Pulmonary nodule  -Recent CT chest on 11/07/2023:   Evolving/increasing nodular consolidation in the subsegmental medial right middle lobe and new tree-in-bud opacities in the anterior basilar left lower lobe, favor infectious bronchopneumonia. Recommend follow-up CT chest in 3 months to confirm resolution     -Given concern for Bronchopneumonia, she was prescribed abx. However she reports she did NOT take these abx as clinically she is stable/well  -Agree with NOT taking abx as clinically she is well and without symptoms  -Repeat Chest CT without contrast in December 2025  -She was referred to Pulmonology as well and has visit scheduled with PFT on 01/30/2024       Acute idiopathic thrombocytopenic purpura    (CMS-HCC)  -Visit  scheduled with Hematology on 04/02/2024     HEALTH MAINTENANCE  -Pap smear due in 08/2025    To keep scheduled appointment in December 2025    I personally spent 40 minutes face-to-face and non-face-to-face in the care of this patient, which includes all pre, intra, and post visit time on the date of service.  All documented time was specific to the E/M visit and does not include any procedures that may have been performed.      Chief Complaint:   Chief Complaint   Patient presents with    Follow-up        History of Present Illness  Jennifer Huynh is a 23 year old female who presents with blood in stool and fatigue.    She has ongoing blood and mucus in her stool with each bowel movement. There is no significant increase in stool frequency or consistency, although she occasionally has loose stools. She has not started antibiotics recently and is concerned about the potential for C. difficile recurrence. She reports no respiratory symptoms.    She experiences persistent fatigue and muscle aches, feeling 'super fatigued all the time' regardless of sleep or naps. Her joints feel better, but the fatigue remains a significant issue.    She has noticed persistent visual floaters, described as 'little squiggly lines', present all day long since returning home from the hospital. There is no associated pain, loss of vision, or blurred vision. The floaters are more noticeable when she shifts her focus, especially while using the computer.    Her heart rate is consistently high, often around 120 bpm while sitting, and she can feel it bounding even at rest. This has been a persistent issue since before her surgery. She occasionally experiences lightheadedness when changing positions but drinks plenty of water. No anxiety is reported despite the elevated heart rate.      Allergies:   Allergies as of 11/22/2023 - Reviewed 11/22/2023   Allergen Reaction Noted    Pistachio nut Shortness Of Breath 12/11/2013       Problem List: Problem List[1]    The following information was reviewed by members of the visit team:  Allergies - Medications - Medical History - Surgical History - Family   History -          Vitals:    11/22/23 1036   BP: 129/83   Pulse: 83   Temp: 36.7 ??C (98 ??F)   SpO2: 97%   Weight: 100.7 kg (222 lb)   Height: 170.2 cm (5' 7)     Body mass index is 34.77 kg/m??.    Wt Readings from Last 3 Encounters:   11/22/23 100.7 kg (222 lb)   11/22/23 (!) 100.8 kg (222 lb 3.2 oz)   10/31/23 98.7 kg (217 lb 8 oz)       ROS: ROS negative unless otherwise noted in HPI.    EXAM:   Physical Exam  Constitutional:       General: She is not in acute distress.     Appearance: Normal appearance. She is not ill-appearing, toxic-appearing or diaphoretic.   HENT:      Head: Normocephalic and atraumatic.      Mouth/Throat:      Mouth: Mucous membranes are moist.   Eyes:      General:         Right eye: No discharge.         Left eye: No discharge.      Extraocular Movements: Extraocular  movements intact.      Conjunctiva/sclera: Conjunctivae normal.   Cardiovascular:      Rate and Rhythm: Regular rhythm. Tachycardia present.      Heart sounds: No murmur heard.  Pulmonary:      Effort: Pulmonary effort is normal. No respiratory distress.      Breath sounds: Normal breath sounds. No stridor. No wheezing, rhonchi or rales.   Musculoskeletal:         General: Normal range of motion.      Right lower leg: No edema.      Left lower leg: No edema.   Neurological:      General: No focal deficit present.      Mental Status: She is alert and oriented to person, place, and time.      Cranial Nerves: No cranial nerve deficit.   Psychiatric:         Mood and Affect: Mood normal.         Behavior: Behavior normal.                  [1]   Patient Active Problem List  Diagnosis    Crohn's disease of both small and large intestine without complication    (CMS-HCC)    Crohn's disease of both small and large intestine with rectal bleeding    (CMS-HCC)    Acute idiopathic thrombocytopenic purpura    (CMS-HCC)    Severe anxiety    Moderate major depression (CMS-HCC)    Iron  deficiency anemia due to chronic blood loss    Iron  deficiency    Vitamin D  deficiency    Nephrolithiasis    Eosinophilic esophagitis    History of ITP    Crohn's disease    (CMS-HCC)    Hypercoagulable state (HHS-HCC)    Right lower quadrant abdominal pain    Intestinal anastomotic leak    C. difficile diarrhea

## 2023-11-23 LAB — ANTI-NEUTROPHILIC CYTOPLASMIC ANTIBODY
MPO AB: NEGATIVE
MPO-QUANT: 4 U/mL (ref <21.0)
PR3 AB: POSITIVE — AB
PR3-QUANT: 26.7 U/mL — ABNORMAL HIGH (ref <21.0)

## 2023-11-23 LAB — QUANTIFERON TB GOLD PLUS
QUANTIFERON ANTIGEN 1 MINUS NIL: 0.02 [IU]/mL
QUANTIFERON ANTIGEN 2 MINUS NIL: 0.01 [IU]/mL
QUANTIFERON MITOGEN: 9.94 [IU]/mL
QUANTIFERON TB GOLD PLUS: NEGATIVE
QUANTIFERON TB NIL VALUE: 0.06 [IU]/mL

## 2023-11-23 LAB — C3 COMPLEMENT: C3 COMPLEMENT: 177 mg/dL — ABNORMAL HIGH (ref 84–160)

## 2023-11-23 LAB — TB AG2: TB AG2 VALUE: 0.07

## 2023-11-23 LAB — TB AG1: TB AG1 VALUE: 0.08

## 2023-11-23 LAB — TB NIL: TB NIL VALUE: 0.06

## 2023-11-23 LAB — TB MITOGEN: TB MITOGEN VALUE: 10

## 2023-11-24 LAB — EXTRACTABLE NUCLEAR ANTIGEN: ENA SCREEN: <0.1 ENA Units (ref <0.70)

## 2023-11-25 LAB — ANA: ANTINUCLEAR AB (MAYO): 1:640 {titer} — AB

## 2023-11-28 DIAGNOSIS — D693 Immune thrombocytopenic purpura: Principal | ICD-10-CM

## 2023-11-29 DIAGNOSIS — R911 Solitary pulmonary nodule: Principal | ICD-10-CM

## 2023-11-29 NOTE — Unmapped (Signed)
 Pulmonary Clinic     Referring Physician :  Benison Huynh Huynh  PCP:     Jennifer Huynh, Jennifer Huynh, Jennifer Huynh    HISTORY:     History of Present Illness:  Jennifer Huynh is a 23 y.o. with the below medical issues who I am seeing for evaluation of abnormal CT scan    Per chart review, Jennifer Huynh has had an eventful summer, notable for a 07/2024 Crohn's flare followed by a 08/2023 ileocolic resection complicated by an ileo-anastomatic leak and C diff infection for which she was readmitted 09/2023. She was treated with Zosyn for the anastamotic leak and PO vancomycin  for C diff. She completed a course of augmentin  on discharge. As part of this series of hospitalizations she received numerous CT scans of which she was noted to have developed a RML nodule (1.3 cm) between CT A/Ps 8/12 and 8/17 (not seen 8/12). A CT Chest done (outpatient) 9/15 re-demonstrated the nodule along with very mild LLL tree-in-bud. This abnormality developed in the midst of her antibiotic course (zosyn -> augmentin ).         As part of her crohn's disease monitoring she has been had serial Quantiferon TB tests that are negative, most recently 10/2023. There is note from her most recent GI visit that she did have associated chest pain and shortness of breath and was prescribed antibiotics but she did not take them.     Past Medical History: The medical and surgical history were personally reviewed and updated in the patient's electronic medical record. Pertinent positives are documented above.  - ITP  - Crohns   - s/p ileocolonic resection on 09/21/23, c/b anastomic leak   - on skyrizi  11/2023, started 10/2023       - prior x with methotrexate (2018), infliximab  (2018 - 08/2023)  - Hx of C diff 09/2023  - eosinophilic esophagitis  - anxiety    Social History  - Smoking: none  - Vaping: former  - Chewing:  - Other ilicits:  - Alcohol use:  - Occupation: in nursing school  - Exposures:   - Travel History:    FMHx:   Home Medications: Medications were reviewed and updated in the patient's electronic medical record. Pertinent positives are documented above.  Allergies: Allergies were reviewed and updated in the patient's electronic medical record. Pertinent positives are documented above.  Review of Systems: A comprehensive review of systems was completed and negative except as noted in HPI.    PHYSICAL EXAM:     BP 118/85 (BP Site: L Arm, BP Position: Sitting, BP Cuff Size: Medium)  - Pulse 102  - Temp 36.5 ??C (97.7 ??F) (Skin)  - Ht 171.9 cm (5' 7.68)  - Wt (!) 101.2 kg (223 lb)  - LMP 11/29/2023 (Exact Date)  - SpO2 98%  - BMI 34.23 kg/m??     Sitting comfortably  No cough  No wheeze      LABORATORY and RADIOLOGY DATA:      I have personally reviewed all of the below data in the chart and pulled from extra-chart sources (CareEverywhere, Media tab)     Pulmonary Function Tests/Interpretation:        - ANCA PR3 positive  - ANA 1:640  - ENA negative  - CRP 40 > 13  - prior pos SSA ab     ASSESSMENT and PLAN     Problem List    #abnormal CT     Assessment/Plan    Lung nodule  Overview:  Jennifer Huynh presents with a lung nodule for further evaluation and follow up. This nodule was not visualized 5 days earlier though it is not clear to me whether that CT goes up high enough to visualize it. I Jennifer Huynh think the CT A/P from June goes high enough and it does not appear to be there. In either case, the nodule develops very quickly. The differential would include bacterial infection, mucus impaction, atypical infection in an immunocompromised host (fungal) > autoimmune manifestation of crohns (or other dysregulated immune pulmonary condition (ANCA+, SSA+) >> malignancy. The antibiotic course for her anastomotic leak should have covered bacterial pneumonia and I would not repeat it without more evidence of bacterial infection given her C. Diff.     We discussed interval imaging at 3 months from the original CT A/P - which will be mid November. If it has not definitively resolved (or isn't clearly resolving) and non-invasive testing does not provide an answer, she probably warrants bronchoscopy given her immunosuppression. She should notify us  sooner if she develops symptoms.  - fungal antibodies today, fungitell, galactomannan (noting recent beta-lactam exposure may produce low-level false-positive)  - LRCX induced sputum (cannot Jennifer Huynh in clinic, will schedule for 10/17)  - CT Chest ~11/17, can discontinue 01/2024 CT Chest      Orders:  -     Histo/Blasto Ag, Serum; Future  -     Fungitell Assay; Future  -     Aspergillus Galactomannan Antigen, Serum; Future  -     Coccidioides Antibodies; Future  -     CT Chest Wo Contrast; Future    Other orders  -     Ambulatory Referral to Pulmonology         The patient was seen with Jennifer Huynh and will return to clinic in 1 month or sooner if needed.     Jennifer Huynh, Huynh, Jennifer Huynh, Jennifer Huynh    Prentice Maffucci, MD, [facility]  Pulmonary and Critical Care Fellow

## 2023-11-29 NOTE — Unmapped (Addendum)
-   Please get a CT Chest ~11/17. Please call [facility] at [phone] to schedule your test    You were seen in clinic today by Dr. Feliciano - it was great to see you and thank you for letting me participate in your care.    Here are some things you should know about contacting the [facility]:  To contact your care team, you can either send a message via MyChart or contact the clinic at [phone].  My [facility] Chart is for non-urgent messages. This means you have a simple medical question that does not require an immediate response. Good examples of types of questions that we want to hear about:  - You need a refill.  - You are unable to fill a new prescription because it is too much money. Please let me know if this is the case.  - We discussed a new scan or test but it is not scheduled and you're not sure why.     If you need immediate attention, call 911. If you have an non-emergent but urgent issue that you feel needs a response the same day, you should contact your primary care provider or be evaluated at an Urgent Care clinic.    Responses may take up to 3 business days. Your message will be read by your provider or another medical team member who may respond on your provider's behalf. We will do our best to respond as quickly as possible, as your message is important to us . However, many of our providers have other duties (inpatient hospital work, Producer, television/film/video activities, teaching) that may make them not available for same day responses. If you have sent a MyChart message to the clinic and have not received a response 3 three business days, please call us  at [phone] to speak to a nurse. Some questions cannot be answered through messages in My [facility] Chart. Depending on your question, your provider's office may ask you to schedule an appointment.    Information sent through My [facility] Chart will become part of your medical record.    Please be advised Epic now releases test results to MyChart as soon as they are available which means you will see may see your test results before I do.    For urgent lung issues after hours, you can call the hospital operator [phone]) and ask for the Pulmonary Fellow On Call. This doctor can provide some guidance and will send a message to your regular lung doctor the next morning.    I don't have a MyChart. Why should I get one?   - It's encrypted, so your information is secure  - It's a quick, easy way to contact the care team, manage appointments, see test results, and more!    How do I sign-up for MyChart?   - Download the MyChart app from the [employer] or [employer] and sign-up in the app  - Sign-up online at [email or web address]

## 2023-11-30 ENCOUNTER — Ambulatory Visit
Admit: 2023-11-30 | Discharge: 2023-11-30 | Payer: Medicaid (Managed Care) | Attending: Student in an Organized Health Care Education/Training Program | Primary: Student in an Organized Health Care Education/Training Program

## 2023-11-30 ENCOUNTER — Encounter: Admit: 2023-11-30 | Discharge: 2023-11-30 | Payer: Medicaid (Managed Care)

## 2023-11-30 ENCOUNTER — Inpatient Hospital Stay: Admit: 2023-11-30 | Discharge: 2023-11-30 | Payer: Medicaid (Managed Care)

## 2023-11-30 DIAGNOSIS — R911 Solitary pulmonary nodule: Principal | ICD-10-CM

## 2023-11-30 MED ADMIN — risankizumab-rzaa (SKYRIZI) 600 mg in sodium chloride (NS) 0.9 % 100 mL IVPB: 600 mg | INTRAVENOUS | @ 15:00:00 | Stop: 2023-11-30

## 2023-11-30 NOTE — Unmapped (Signed)
 Patient presents to [facility] for 2nd dose of Skyrizi . Patient alert and oriented x 3, ambulatory. Patient denies any fever, no sign and symptoms of infection.  VSS. Inserted gauge # 24 catheter onto the left antecubital with good blood return , patient tolerated well without complication. Call bell within reach.     1036  Skyrizi  (Risankizumab -rzaa) 600 mg in 0.9% 100 ml IVPB started.     1136 Infusion complete. Patient tolerated well without any reaction. VSS. IV flushed as per protocol and removed without complications.     1138 Patient discharge in stable manner.

## 2023-11-30 NOTE — Unmapped (Signed)
 Skyrizi  OBI will be due 01/25/2024. Script sent to [facility] for processing and fill. Approval in place. Patient will be notified of due date and training resources, message on delay send to 12/24/2023.     Medication (Brand/Generic): SKYRIZI  360 mg/2.4 mL (150 mg/mL) Injt (risankizumab -rzaa)   Insurance Company: [place] Managed Medicaid - [employer]   Authorization ID/Case Number: [id number] - [id number]   Coverage Dates: 11/03/2023 through 11/02/2024

## 2023-12-04 LAB — FUNGITELL ASSAY: FUNGITELL QUALITATIVE: UNDETERMINED — AB

## 2023-12-06 LAB — HISTO/BLASTO AG, SERUM
HISTOPLASMA/BLASTOMYCES AG RESULT: NOT DETECTED
HISTOPLASMA/BLASTOMYCES AG VALUE: NOT DETECTED ng/mL

## 2023-12-06 LAB — ASPERGILLUS GALACTOMANNAN ANTIGEN, SERUM: ASPERGILLUS AG INTERP: NEGATIVE (ref <0.5)

## 2023-12-07 NOTE — Unmapped (Signed)
 Rheumatology Initial Consultation Note    PCP: ??Mangel, Benison Pap, DO  Referring Provider: Benison Pap Mangel    Chief concern: joint pain     History of Present Illness:     HPI: Jennifer Huynh is a 23 y.o. female with a history of Crohn disease s/p ileocolonic resection, thrombocytopenia treated as ITP with IVIg, steroids, and Nplate in 03/2022, iron  deficiency anemia, and recent C diff infection who is being seen in consultation at the request of Benison Pap Mangel for evaluation of joint pain. Reviewed prior records including econsult note from Dr. Gleen in 03/2023 which indicates positive ANA in the setting of long-term infliximab  therapy without other reported signs of drug-induced lupus. Also reviewed notes from GI, PCP, and pulmonology, in addition to recent discharge summaries. In sum, she was diagnosed with Crohn disease in 2018, which was relatively controlled on infliximab  for several years. In summer 2025 she developed colonic stricture and had an ileocolonic resection, complicated by post-operative infection and then C diff infection. She may have had pneumonia, and a new lung nodule has been monitored by pulmonology. Infliximab  was held in June 2025 with these infections, eventually replaced by Skyrizi  infusions starting in 10/2023. In the midst of this she developed joint pain in her hips, knees, and ankles and was referred to rheumatology.     History of Present Illness  Jennifer Huynh is a 23 year old female with Crohn's disease who presents with joint pain and body aches.    She has been experiencing joint pain and body aches. The joint pain began after a bowel resection surgery and a subsequent C. difficile infection. Initially, both hips, knees, and her left ankle were affected, with the pain being severe enough to impair her ability to walk. The pain was described as deep inside the knees and on the outside of the left ankle. The symptoms have gradually improved over time, particularly after starting Skyrizi , with significant relief noted about a week and a half after the first dose. She does not take NSAIDs due to gastrointestinal issues but uses Tylenol  for pain management.    She has a history of iron  deficiency anemia, for which she has received iron  infusions during hospital admissions for Crohn's disease. Another infusion is scheduled, although the exact date is uncertain. She experiences brain fog and general achiness.    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 The ITP resolved after several months of monitoring.    She experiences episodes of dry eyes and occasional dry mouth, but no significant color changes in her fingertips when cold. She has difficulty swallowing, which she attributes to eosinophilic esophagitis (EOE).    Her Crohn's disease has been associated with mouth ulcers, which were more frequent when the disease was poorly managed. Recently, she had a single ulcer inside her lip. She also reports recent blood and mucus in her stool, which has started to resolve. She experiences tenderness in the abdomen, not at the incision site but internally.    She has been experiencing floaters in her vision, prompting a referral to an ophthalmologist. She has not used steroid eye drops and has a history of uveitis.    No family history of autoimmune diseases, although a cousin has celiac disease. 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    She has episodes of pain with deep breaths, lasting up to 30 minutes, and describes  it as more one-sided. She also reports upper back stiffness upon waking.    Current Therapy:  - Skyrizi  infusions 600 q4 wks started 10/2023  - budesonide     IBD Therapy History:  - MTX, 2018; secondary non-response  - Humira  2018-2019; secondary non-response (developed antibodies)  - infliximab  up to 20mg /kg, 2019-08/2023; secondary non-response   - switched to biosimilar 120mg  subQ q2wks in 08/2022    Medical History:   Crohn disease  ITP  IDA  C diff infection    Social History: ??  ?Short Social History[1]??  ??Occupation: nursing school  Exposures: vaping in high school  Tobacco: none  Alcohol: none    ??Family History: Reviewed history in Epic with the patient and includes the following  ??Family History[2]?  No family history of autoimmune disease including no SLE, Sj??gren, rheumatoid arthritis, psoriasis, psoriatic arthritis, IBD, uveitis, multiple miscarriages, recurrent thrombosis, or other autoimmune disorders.    Current Medications:  Medications - Current, Listed Continuously[3]  Objective    Physical Exam:  Vitals:    12/09/23 1332   BP: 144/88   BP Site: L Arm   BP Position: Sitting   BP Cuff Size: Large   Pulse: 108   Temp: 35.9 ??C (96.7 ??F)   TempSrc: Temporal   Weight: (!) 102.6 kg (226 lb 3.2 oz)   Body mass index is 34.72 kg/m??.     Constitutional:   Relatively well-appearing; moves comfortably around the exam room     Ophthalmic:   Sclerae clear with moist membranes     ENT:   Moist oropharynx without oral or nasal ulcers; no significant dental caries     Lymphatic:  No cervical lymphadenopathy      Cardiovascular:  Normal rate and regular rhythm without obvious murmur or rub; no peripheral edema     Respiratory:  No adventitious breath sounds, breathing comfortably on room air; no clubbing     Skin:    No plaques, scarring, or other rash on exposed skin, scalp, or behind ears; no onycholysis or nail pitting     Musculoskeletal:   Back: no SI tenderness  Shoulders: full active ROM without AC joint tenderness  Elbows: full active ROM without warmth, effusion, or nodules  Wrists: full passive ROM without warmth or effusion  Hands: able to make full fist without architectural changes; no warmth, tenderness, or bogginess in bilateral MCPs, PIPs, or DIPs; no dactylitis  Hips: slight pain or catching in groin with exaggerated flexion and external rotation; negative FABER  Knees: full passive ROM without warmth, effusion, or architectural changes  Ankles: full tibiotalar and subtalar passive ROM without pain  Feet: negative MTP squeeze without architectural changes; no dactylitis     Neurological:  Able to ambulate from chair to exam table without difficulty; no wrist drop or foot drop; full strength in bilateral hand grip, deltoids, and hip flexors          Data Review:    Reviewed results scanned in Media Tab and/or available in Epic and CareEverywhere including the following, with personal interpretation in the assessment section:     Lab Results   Component Value Date    Creatinine 0.72 11/22/2023    Creatinine 0.71 03/28/2017    AST 16 11/22/2023    AST 9 03/28/2017    ALT 20 11/22/2023    ALT 9 03/28/2017    Alkaline Phosphatase 75 11/22/2023    Alkaline Phosphatase 71 03/28/2017    WBC 7.2 11/22/2023  WBC 8.8 12/29/2022    Absolute Neutrophils 3.9 11/22/2023    Absolute Neutrophils 4.4 12/29/2022    HGB 12.9 11/22/2023    HGB 13.6 12/29/2022    Platelet 297 11/22/2023    Platelet 287 12/29/2022     Serologies:  Lab Results   Component Value Date    Antinuclear Ab Positive 1:640 (A) 11/22/2023    ANA Titer 1 >=1:640 04/18/2022    ANA Pattern 1 Homogenous 04/18/2022    ENA Screen <0.10 11/22/2023    Anti-RNP Antibody Negative 04/23/2022    ANTI-SSA AB Positive (A) 04/23/2022    ANTI-SSB AB Negative 04/23/2022    ANTI-SM AB Negative 04/23/2022    ANTI-SCL-70 AB Negative 04/23/2022    ANTI-JO1 AB Negative 04/23/2022    PR3-Quant 26.7 (H) 11/22/2023    MPO-Quant 4.0 11/22/2023    C3 Complement 177 (H) 11/22/2023    C4 Complement 23.2 11/22/2023    Sed Rate 56 (H) 11/22/2023    Sed Rate 42 (H) 03/28/2017    CRP 12.9 (H) 11/22/2023    CRP 8.6 (H) 03/28/2017     Infectious Screening:  Lab Results   Component Value Date    Hep B Core IgM Nonreactive 04/18/2022    Hep B Core Total Ab Nonreactive 04/19/2023    Hep B Surface Ag Nonreactive 04/19/2023    Hep B S Ab Nonreactive 04/19/2023    Hepatitis C Ab Nonreactive 04/19/2023    HIV Antigen/Antibody Combo Nonreactive 04/18/2022    Quantiferon TB Gold Plus Interpretation Negative 11/22/2023    Quantiferon TB Gold Plus Interpretation  07/26/2017       The QuantiFERON-TB Gold Plus (QFT-Plus) assay is intended for use as an indirect test for Mycobacterium tuberculosis infection. It should be used in conjunction with risk assessment, radiography and other medical and diagnostic evaluations. The QFT-Plus assay measures Interferon gamma production from lymphocytes in response to incubation with M. tuberculosis antigens that are contained in 2 separate antigen tubes (TB1 and TB2).  In total, 4 tubes are obtained for the QFT-Plus test (TB1, TB2, Nil and Mitogen).    A negative QFT-Plus does not preclude M. tuberculosis infection or tuberculosis disease.  False negative results may occur in patients with very early stage infection prior to the development of a cellular immune response or improper handling of sample tubes.  The effect of low lymphocyte counts or concomitant immunosuppression have not been determined.    A positive test indicates infection with M. tuberculosis. However, a positive test may also be due to exposure to M. kansasii, M. szulgai or M. marinum.     Go to ([email or web address].[email or web address]) for further details.       I personally reviewed the images from CT chest 11/07/23 which showed RLL nodule with surrounding ground glass.    Assessment/Plan:     Laniesha Das is a 23 y.o. female with a history of IBD, ITP, and recent C diff who is seen in consultation for joint pain.     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 It is unclear whether her significant arthralgia was due to a post-infectious arthritis, IBD arthritis flare in the setting of being off infliximab , or arthralgia from significant iron  deficiency. If she has recurrent joint swelling, stiffness, or significant pain, she can return and we can discuss additional therapy.  - continue Skyrizi  with GI  -  return PRN if recurrence    Myalgia  Fatigue  Iron  deficiency  Agree with IV iron  infusions.     Positive ANA  Positive PR3 antibody  We discussed that she does not have any features of TNF-induced lupus or SLE at this point. Her positive ANA is likely due to prior long-term infliximab  use or IBD itself and may not portend any increased risk for SLE. Her low positive PR3 antibody is attributable to IBD itself and does not portend a risk for GPA. Her isolated episode of thrombocytopenia or ITP in 2024 is likely unrelated to these serologies.     Lung nodule  Pulmonology is monitoring this serially. She does not have features of Sj??gren syndrome. The nodule is not consistent with LIP.    Immunization Counseling  Immunization History   Administered Date(s) Administered    COVID-19 VAC,MRNA,TRIS(12Y UP)(PFIZER)(GRAY CAP) 05/10/2020    COVID-19 VACC,MRNA,(PFIZER)(PF) 12/12/2019    DTaP 08/28/2001, 08/28/2001, 10/30/2001, 10/30/2001, 01/01/2002, 01/01/2002, 12/07/2002, 12/07/2002, 11/22/2005, 11/22/2005    DTaP, Unspecified Formulation 08/28/2001, 10/30/2001, 01/01/2002, 12/07/2002, 11/22/2005    Hepatitis A Vaccine Pediatric / Adolescent 2 Dose IM 09/19/2018    Hepatitis B Vaccine, Unspecified Formulation 2002-01-10, 2001/07/14, 08/28/2001, 08/28/2001, 01/01/2002, 01/01/2002    HiB, unspecified 08/28/2001, 10/30/2001, 01/01/2002, 07/13/2002    HiB-PRP-T 08/28/2001, 08/28/2001, 10/30/2001, 10/30/2001, 01/01/2002, 01/01/2002, 07/13/2002, 07/13/2002    Human Papillomavirus Vaccine,9-Valent(PF) 12/11/2013, 12/11/2013, 03/29/2014, 03/29/2014, 09/19/2018    INFLUENZA ADJUVANTED PF, IIV3(83YR UP)(FLUAD) 11/22/2023    Influenza Vaccine Quad(IM)6 MO-Adult(PF) 03/30/2019, 11/21/2019, 01/27/2021, 11/06/2021    Influenza Virus Vaccine, unspecified formulation 03/30/2019, 12/10/2022    MMR 12/07/2002, 12/07/2002, 11/22/2005, 11/22/2005    Meningococcal B Vaccine, OMV Adjuvanted(Bexsero) 09/19/2018    Meningococcal Conjugate MCV4P 11/07/2013, 11/07/2013, 09/19/2018    Pneumococcal Conjugate 13-Valent 08/28/2001, 10/30/2001, 01/01/2002, 12/07/2002    Pneumococcal Conjugate 20-valent 05/26/2023    Pneumococcal conjugate -PCV7 08/28/2001, 08/28/2001, 10/30/2001, 10/30/2001, 01/01/2002, 01/01/2002, 12/07/2002, 12/07/2002    Poliovirus,inactivated (IPV) 08/28/2001, 08/28/2001, 10/30/2001, 10/30/2001, 01/01/2002, 01/01/2002, 11/22/2005, 11/22/2005    SHINGRIX-ZOSTER VACCINE (HZV),RECOMBINANT,ADJUVANTED(IM) 07/04/2023    TdaP 10/11/2012, 10/11/2012, 10/07/2022    Varicella 07/13/2002, 07/13/2002, 11/14/2006, 11/14/2006     Follow-up: Return if symptoms worsen or fail to improve.    We discussed the above including diagnosis and recommendations, agreed on the above plan, and all questions were answered. Patient seen and discussed with the rheumatology attending, Dr. Stacia.    Signa Kitchens, MD, MPH  [facility] Rheumatology PGY-5    CC:   Mangel, Benison Pap, DO  Benison Pap Mangel    Diagnoses and all orders for this visit:    Arthralgia of both knees    ANA positive    Crohn's disease of small intestine with intestinal obstruction (CMS-HCC)    Iron  deficiency    Myalgia    Other fatigue    Other orders  -     Ambulatory referral to Rheumatology             [1]   Social History  Tobacco Use    Smoking status: Former     Types: e-Cigarettes     Passive exposure: Past    Smokeless tobacco: Never   Vaping Use    Vaping status: Former   Substance Use Topics    Alcohol use: Not Currently    Drug use: Never   [2]   Family History  Problem Relation Age of Onset    Alcohol abuse Father     Depression Father     Drug abuse Father  Heart disease Father     Hypertension Father     Mental illness Father Clotting disorder Father     Aneurysm Paternal Grandfather     Asthma Sister     Colorectal Cancer Neg Hx     Autoimmune disease Neg Hx     Inflammatory bowel disease Neg Hx    [3]    acetaminophen  (TYLENOL ) 500 MG tablet Take 2 tablets (1,000 mg total) by mouth every six (6) hours as needed for pain.    budesonide  (ENTOCORT EC ) 3 mg 24 hr capsule Take 3 capsules (9 mg total) by mouth every morning.    empty container Misc Use    ondansetron  (ZOFRAN -ODT) 4 MG disintegrating tablet Take 1 tablet (4 mg total) by mouth every eight (8) hours as needed for up to 7 days.    pantoprazole  (PROTONIX ) 40 MG tablet Take 1 tablet (40 mg total) by mouth Two (2) times a day (30 minutes before a meal).    risankizumab -rzaa (SKYRIZI ) 360 mg/2.4 mL (150 mg/mL) Injt Inject the contents of 1 cartridge (360mg ) under the skin every 8 weeks      albuterol  2.5 mg /3 mL (0.083 %) nebulizer solution 2.5 mg    sodium chloride 3 % NEBULIZER solution 4 mL

## 2023-12-09 ENCOUNTER — Ambulatory Visit: Admit: 2023-12-09 | Discharge: 2023-12-10 | Payer: Medicaid (Managed Care)

## 2023-12-09 ENCOUNTER — Ambulatory Visit
Admit: 2023-12-09 | Discharge: 2023-12-10 | Payer: Medicaid (Managed Care) | Attending: Student in an Organized Health Care Education/Training Program | Primary: Student in an Organized Health Care Education/Training Program

## 2023-12-09 DIAGNOSIS — M791 Myalgia, unspecified site: Principal | ICD-10-CM

## 2023-12-09 DIAGNOSIS — E611 Iron deficiency: Principal | ICD-10-CM

## 2023-12-09 DIAGNOSIS — R5383 Other fatigue: Principal | ICD-10-CM

## 2023-12-09 DIAGNOSIS — M25562 Pain in left knee: Principal | ICD-10-CM

## 2023-12-09 DIAGNOSIS — K50012 Crohn's disease of small intestine with intestinal obstruction: Principal | ICD-10-CM

## 2023-12-09 DIAGNOSIS — R7689 ANA positive: Principal | ICD-10-CM

## 2023-12-09 DIAGNOSIS — R911 Solitary pulmonary nodule: Principal | ICD-10-CM

## 2023-12-09 DIAGNOSIS — M25561 Pain in right knee: Principal | ICD-10-CM

## 2023-12-09 LAB — COCCIDIOIDES ANTIBODIES
COCCIDIOIDES COMPLEMENT FIXATION: NEGATIVE
COCCIDIOIDES IMMUNODIFFUSION IGG: NEGATIVE
COCCIDIOIDES IMMUNODIFFUSION IGM: NEGATIVE

## 2023-12-09 NOTE — Unmapped (Addendum)
 I'm not sure if what you had represents a post-infection arthritis episode or more of an inflammatory bowel disease-associated arthritis. If this happens again please reach out and we are happy to re-evaluate and discuss treatment options.     Your ANA is positive most likely from infliximab . You do not have any signs of TNF-inhibitor induced lupus. ANA can also be positive from inflammatory bowel disease itself.    Your PR3 antibody is low titer positive from the IBD itself. It is a common phenomenon. You do not have nay signs of GPA.     Your fatigue and aches should improve some with iron  infusions.

## 2023-12-12 ENCOUNTER — Ambulatory Visit
Admit: 2023-12-12 | Discharge: 2023-12-13 | Payer: Medicaid (Managed Care) | Attending: Student in an Organized Health Care Education/Training Program | Primary: Student in an Organized Health Care Education/Training Program

## 2023-12-12 MED ADMIN — albuterol 2.5 mg /3 mL (0.083 %) nebulizer solution 2.5 mg: 2.5 mg | RESPIRATORY_TRACT | @ 20:00:00 | Stop: 2023-12-12

## 2023-12-12 NOTE — Unmapped (Signed)
 Sputum induction performed with Albuterol  0.083% and Sodium Chloride 3%. Treatment was Well tolerated  , and was not able to produce a specimen for testing.  Gave patient sterile specimen cups and directions to how to drop off a sputum sample.  Patient verbalized understanding.

## 2023-12-13 NOTE — Unmapped (Signed)
 I saw and evaluated the patient, participating in the key portions of the service.  I reviewed the resident???s note.  I agree with the resident???s findings and plan. Ezekiel Slocumb, MD

## 2023-12-17 NOTE — Progress Notes (Signed)
I saw and evaluated the patient, participating in the key portions of the service.  I reviewed the Fellow's (subspecialty resident’s) note.  I agree with the Fellow's findings and plan.     Star Cheese G Reyli Schroth, MD

## 2023-12-28 ENCOUNTER — Encounter: Admit: 2023-12-28 | Discharge: 2023-12-29 | Payer: Medicaid (Managed Care)

## 2023-12-28 MED ADMIN — risankizumab-rzaa (SKYRIZI) 600 mg in sodium chloride (NS) 0.9 % 100 mL IVPB: 600 mg | INTRAVENOUS | @ 20:00:00 | Stop: 2023-12-28

## 2023-12-28 NOTE — Progress Notes (Signed)
 Pt presents for Skyrizi  infusion. Pt denies any recent infections, fevers, antibiotic use. VSS, IV placed. No labs or premeds ordered. Pt educated on side effects and verbalized understanding.     1515 Skyrizi  600mg  started to infuse over at least 1hr per order    1614 Skyrizi  infusion finished. Pt tolerated with no ill effects. IV flushed with D5. VSS.    1618 Pt tolerated post infusion observation with no ill effects. IV d/c'ed, gauze and coban applied. Pt discharged from clinic in NAD.

## 2024-01-09 NOTE — Telephone Encounter (Signed)
 Will confirm with [facility] SP planned delivery for due date 01/25/2024

## 2024-01-11 MED ORDER — EMPTY CONTAINER
3 refills | 0.00000 days
Start: 2024-01-11 — End: ?

## 2024-01-11 NOTE — Progress Notes (Signed)
 [facility]    Patient Onboarding/Medication Counseling    Sent MyChart message with instructions and link to [employer] video   MyChart Questionnaire Opt In     Jennifer Huynh is a 23 y.o. female with Crohn's who I am counseling today on initiation of therapy.  I am speaking to the patient.    Was a nurse, learning disability used for this call? No    Verified patient's date of birth / HIPAA.    Specialty medication(s) to be sent: Inflammatory Disorders: Skyrizi       Non-specialty medications/supplies to be sent: sharps      Medications not needed at this time: n/a         Skyrizi  (risankizumab )    Medication & Administration     Dosage: Crohn's disease: Inject 360mg  under the skin every 8 weeks starting 4 weeks after 3rd IV induction dose (Completed at weeks 0, 4, and 8)  IV doses: 9/8, 10/8, 11/5  1st OBI due: 12/3    Lab tests required prior to treatment initiation:  Tuberculosis: Tuberculosis screening resulted in a non-reactive Quantiferon TB Gold assay. (Completed: 01/24/2024)    Administration:     Air Traffic Controller all supplies needed for injection on a clean, flat working surface: Plastic tray containing the On-body injector and prefilled cartridge, alcohol swab, sharps container, etc.  Remove unopened carton from the refrigerator and allow it to warm up to room temperature for at least 45 but no more than 90 minutes.  Look at the medication label - look for correct medication, correct dose, and check the expiration date  Remove the On-body injector and prefilled cartridge from plastic tray  Look at the On-body injector - check that it is intact and undamaged. Do NOT close the gray door before the prefilled cartridge is loaded  Look at the prefilled cartridge - the liquid should appear clear and colorless to slightly yellow, you may see tiny white or clear particles. Do NOT twist or remove cartridge top.  Use an alcohol swab to clean the smaller bottom tip of the prefilled cartridge and let it air dry. Do not touch the smaller bottom tip after cleaning.  Insert the smaller bottom tip into the On-Body injector first. Firmly push down until you hear a click. There may be a few drops of medicine on the back of the On-body Injector. That is normal. Close the gray door and squeeze firmly until it snaps closed.   You MUST start the injection within 5 minutes of loading the prefilled cartridge.   Select injection site - you can use the front of your thigh or your belly (but not the area 2 inches around your belly button)  Prepare injection site - wash your hands and clean the skin at the injection site with an alcohol swab and let it air dry, do not touch the injection site again before the injection  Peel the green tabs on the back of the On-body injector to expose the adhesive. This will activate the On-body injector and cause the status light to turn BLUE.   For the belly, move and hold the skin to create a firm, flat surface. You do not need to pull the skin if injecting on the front of the thighs.  When the blue light flashes, it is ready to start the injection.  Place the On-body injector onto the cleaned skin and then firmly press the gray button until you hear a click. This will start the injection  and the light will continuously flash GREEN. This may take up to 5 minutes to complete the full dose.  The On-body Injector will automatically stop when the injection is finished. You will hear beeps and the status light will change to SOLID GREEN.   Remove the On-Body Injector by carefully peeling the adhesive from your skin. Avoid touching the needle or needle cover on the back of the On-Body Injector. You will hear several beeps and the status light will turn off.   Dispose of the used On-Body Injector immediately in engineer, water.      Adherence/Missed dose instructions:  If your injection is given more than 7 days after your scheduled injection date - consult your pharmacist for additional instructions on how to adjust your dosing schedule.    Goals of Therapy     Crohn's Disease  Achieve remission of symptoms  Maintain remission of symptoms  Minimize long-term systemic glucocorticoid use  Prevent need for surgical procedures  Maintenance of effective psychosocial functioning    Side Effects & Monitoring Parameters     Injection site reaction (redness, irritation, inflammation localized to the site of administration)  Signs of a common cold - minor sore throat, runny or stuffy nose, etc.  Felling tired/weak  Headache  Stomach, joint or back pain    The following side effects should be reported to the provider:  Signs of a hypersensitivity reaction - rash; hives; itching; red, swollen, blistered, or peeling skin; wheezing; tightness in the chest or throat; difficulty breathing, swallowing, or talking; swelling of the mouth, face, lips, tongue, or throat; etc.  Reduced immune function - report signs of infection such as fever; chills; body aches; very bad sore throat; ear or sinus pain; cough; more sputum or change in color of sputum; pain with passing urine; wound that will not heal, etc.  Also at a slightly higher risk of some malignancies (mainly skin and blood cancers) due to this reduced immune function.  In the case of signs of infection - the patient should hold the next dose of Skyrizi ?? and call your primary care provider to ensure adequate medical care.  Treatment may be resumed when infection is treated and patient is asymptomatic.  Flu-like symptoms  Warm, red, or painful skin or sores on the body  Severe diarrhea or stomach pain      Contraindications, Warnings, & Precautions     Have your bloodwork checked as you have been told by your prescriber  Talk with your doctor if you are pregnant, planning to become pregnant, or breastfeeding  Discuss the possible need for holding your dose(s) of Skyrizi ?? when a planned procedure is scheduled with the prescriber as it may delay healing/recovery timeline       Drug/Food Interactions     Medication list reviewed in Epic. The patient was instructed to inform the care team before taking any new medications or supplements. No drug interactions identified.   Talk with you prescriber or pharmacist before receiving any live vaccinations while taking this medication and after you stop taking it    Storage, Handling Precautions, & Disposal     Store this medication in the refrigerator.  Do not freeze   If needed, you may store at room temperature for up to 24 hours  Store in original packaging, protected from light  Do not shake  Dispose of used syringes/pens in a sharps disposal container    Current Medications (including OTC/herbals), Comorbidities and Allergies  Current Medications[1]    Allergies[2]    Problem List[3]    Medication list has been reviewed and updated in Epic: Yes    Allergies have been reviewed and updated in Epic: Yes    Appropriateness of Therapy     Acute infections noted within Epic:  No active infections  Patient reported infection: None    Is the medication and dose appropriate considering the patient???s diagnosis, treatment, and disease journey, comorbidities, medical history, current medications, allergies, therapeutic goals, self-administration ability, and access barriers? Yes    Prescription has been clinically reviewed: Yes      Baseline Quality of Life Assessment      How many days over the past month did your Crohn's  keep you from your normal activities? For example, brushing your teeth or getting up in the morning. 0    Financial Information     Medication Assistance provided: Prior Authorization    Anticipated copay of $4 reviewed with patient. Verified delivery address.    Delivery Information     Scheduled delivery date: 11/21    Expected start date: 12/3      Medication will be delivered via Same Day Courier to the prescription address in [facility].  This shipment will not require a signature.      Explained the services we provide at [facility] and that each month we would call to set up refills.  Stressed importance of returning phone calls so that we could ensure they receive their medications in time each month.  Informed patient that we should be setting up refills 7-10 days prior to when they will run out of medication.  A pharmacist will reach out to perform a clinical assessment periodically.  Informed patient that a welcome packet, containing information about our pharmacy and other support services, a Notice of Privacy Practices, and a drug information handout will be sent.      The patient or caregiver noted above participated in the development of this care plan and knows that they can request review of or adjustments to the care plan at any time.      Patient or caregiver verbalized understanding of the above information as well as how to contact the pharmacy at [phone] option 4 with any questions/concerns.  The pharmacy is open Monday through Friday 8:30am-4:30pm.  A pharmacist is available 24/7 via pager to answer any clinical questions they may have.    Patient Specific Needs     Does the patient have any physical, cognitive, or cultural barriers? No    Does the patient have adequate living arrangements? (i.e. the ability to store and take their medication appropriately) Yes    Did you identify any home environmental safety or security hazards? No    Patient prefers to have medications discussed with  Patient     Is the patient or caregiver able to read and understand education materials at a high school level or above? Yes    Patient's primary language is  English     Is the patient high risk? No    Does the patient have an additional or emergency contact listed in their chart? Yes    SOCIAL DETERMINANTS OF HEALTH     At the [facility], we have learned that life circumstances - like trouble affording food, housing, utilities, or transportation can affect the health of many of our patients.   That is why we wanted to ask: are you currently experiencing  any life circumstances that are negatively impacting your health and/or quality of life? No    Social Drivers of Health     Food Insecurity: No Food Insecurity (05/26/2023)    Hunger Vital Sign     Worried About Running Out of Food in the Last Year: Never true     Ran Out of Food in the Last Year: Never true   Tobacco Use: Medium Risk (12/09/2023)    Patient History     Smoking Tobacco Use: Former     Smokeless Tobacco Use: Never     Passive Exposure: Past   Transportation Needs: No Transportation Needs (05/26/2023)    PRAPARE - Therapist, Art (Medical): No     Lack of Transportation (Non-Medical): No   Alcohol Use: Not At Risk (04/19/2023)    Alcohol Use     How often do you have a drink containing alcohol?: Never     How many drinks containing alcohol do you have on a typical day when you are drinking?: Not on file     How often do you have 5 or more drinks on one occasion?: Never   Housing: Low Risk (05/26/2023)    Housing     Within the past 12 months, have you ever stayed: outside, in a car, in a tent, in an overnight shelter, or temporarily in someone else's home (i.e. couch-surfing)?: No     Are you worried about losing your housing?: No   Physical Activity: Not on file   Utilities: Low Risk (05/26/2023)    Utilities     Within the past 12 months, have you been unable to get utilities (heat, electricity) when it was really needed?: No   Stress: Not on file   Interpersonal Safety: Not At Risk (12/09/2023)    Interpersonal Safety     Unsafe Where You Currently Live: No     Physically Hurt by Anyone: No     Abused by Anyone: No   Substance Use: Not on file (12/28/2022)   Intimate Partner Violence: Not At Risk (12/09/2023)    Humiliation, Afraid, Rape, and Kick questionnaire     Fear of Current or Ex-Partner: No     Emotionally Abused: No     Physically Abused: No     Sexually Abused: No   Social Connections: Unknown (06/29/2022)    Received from [facility] Short Social Needs Screening - Social Connection     Would you like help with any of the following needs: food, medicine/medical supplies, transportation, loneliness, housing or utilities?: Not on file   Financial Resource Strain: Low Risk (08/21/2023)    Overall Financial Resource Strain (CARDIA)     Difficulty of Paying Living Expenses: Not very hard   Health Literacy: Not on file   Internet Connectivity: Not on file       Would you be willing to receive help with any of the needs that you have identified today? Not applicable       Jennifer Huynh, PharmD  [facility] Specialty Pharmacist       [1]   Current Outpatient Medications   Medication Sig Dispense Refill    acetaminophen  (TYLENOL ) 500 MG tablet Take 2 tablets (1,000 mg total) by mouth every six (6) hours as needed for pain. 30 tablet 0    budesonide  (ENTOCORT EC ) 3 mg 24 hr capsule Take 3 capsules (9 mg total) by mouth every  morning. 270 capsule 0    empty container Misc Use 1 each 3    empty container Misc Use as directed 1 each 3    risankizumab -rzaa (SKYRIZI ) 360 mg/2.4 mL (150 mg/mL) Injt Inject the contents of 1 cartridge (360mg ) under the skin every 8 weeks 2.4 mL 2     No current facility-administered medications for this visit.   [2]   Allergies  Allergen Reactions    Pistachio Nut Shortness Of Breath   [3]   Patient Active Problem List  Diagnosis    Crohn's disease of both small and large intestine without complication (CMS-HCC)    Crohn's disease of both small and large intestine with rectal bleeding (CMS-HCC)    Acute idiopathic thrombocytopenic purpura    (CMS-HCC)    Severe anxiety    Moderate major depression (CMS-HCC)    Iron  deficiency anemia due to chronic blood loss    Iron  deficiency    Vitamin D  deficiency    Nephrolithiasis    Eosinophilic esophagitis    History of ITP    Crohn's disease    (CMS-HCC)    Hypercoagulable state (HHS-HCC) Right lower quadrant abdominal pain    Intestinal anastomotic leak    C. difficile diarrhea    Lung nodule

## 2024-01-13 MED FILL — EMPTY CONTAINER: 120 days supply | Qty: 1 | Fill #0

## 2024-01-22 NOTE — Assessment & Plan Note (Addendum)
-  Previously followed with hematology, last visit in 03/24/2023  -Repeating labs below, Hematology follow up on 04/02/2024  Orders:    Iron  & TIBC    CBC    Ferritin    -Continue IV infusions as recommended by GI

## 2024-01-22 NOTE — Assessment & Plan Note (Addendum)
-  CT chest without contrast on 11/07/23:   -Evolving/increasing nodular consolidation in the subsegmental medial right middle lobe and new tree-in-bud opacities in the anterior basilar left lower lobe, favor infectious bronchopneumonia.  - Recommend follow-up CT chest in 3 months to confirm resolution     -Referred to pulmonology, visit on 11/30/2023 where the following plan was noted:   -Jennifer Huynh presents with a lung nodule for further evaluation and follow up.   -This nodule was not visualized 5 days earlier though it is not clear to me whether that CT goes up high enough to visualize it.   -I do think the CT A/P from June goes high enough and it does not appear to be there. In either case, the nodule develops very quickly.   -The differential would include bacterial infection, mucus impaction, atypical infection in an immunocompromised host (fungal) > autoimmune manifestation of crohns (or other dysregulated immune pulmonary condition (ANCA+, SSA+) >> malignancy. The antibiotic course for her anastomotic leak should have covered bacterial pneumonia and I would not repeat it without more evidence of bacterial infection given her C. Diff.     -We discussed interval imaging at 3 months from the original CT A/P - which will  be mid November. If it has not definitively resolved (or isn't clearly resolving) and non-invasive testing does not provide an answer, she probably warrants bronchoscopy given her immunosuppression. She should notify us  sooner if she develops symptoms.    - fungal antibodies today, fungitell, galactomannan (noting recent beta-lactam   exposure may produce low-level false-positive)  - LRCX induced sputum (cannot do in clinic, will schedule for 10/17)  - CT Chest ~11/17, can discontinue 01/2024 CT Chest      -Fungal testing was NEGATIVE   -Repeat CT scheduled for 02/10/2024. Pulmonology follow up on 05/23/2024

## 2024-01-22 NOTE — Assessment & Plan Note (Addendum)
-  dx in 2018, previously was on Infliximab . Underwent ileocolic resection in 09/2023    -She is following closely with GI  -Currently no longer taking budesonide  but is receiving Skyrizi  injections.   -At this time, she notes she does not have any GI/abdominal complaints--symptoms appear to be controlled.     -She does not appear to be taking PPI anymore however?   -To continue to follow closely with GI   -If reflux returns, could restart Pantoprazole  and/or take OTC Gaviscon as recommended by GI

## 2024-01-22 NOTE — Progress Notes (Signed)
 Patient ID: Jennifer Huynh, date of birth 2001/01/14 is a 23 y.o. female.    Date: January 25, 2024      Assessment & Plan        Jennifer Huynh  is 23 y.o. female presenting for follow up  Assessment & Plan  Iron  deficiency anemia due to chronic blood loss  Acute idiopathic thrombocytopenic purpura    (CMS-HCC)  -Previously followed with hematology, last visit in 03/24/2023  -Repeating labs below, Hematology follow up on 04/02/2024  Orders:    Iron  & TIBC    CBC    Ferritin    -Continue IV infusions as recommended by GI    Moderate major depression (CMS-HCC)  -She does note persistent fatigue and low energy   -This could be due to her significant PMH but depression and stress could be contributing as well   -E-consult placed to psychiatry on 05/27/2023 regarding potential medications given PMH    -At this time, she would like to avoid medications but is interested in CBT  -Referral placed to VIRTUAL LCSW given her school schedule  Orders:    Ambulatory referral to Social Work; Future    Lung nodule  -CT chest without contrast on 11/07/23:   -Evolving/increasing nodular consolidation in the subsegmental medial right middle lobe and new tree-in-bud opacities in the anterior basilar left lower lobe, favor infectious bronchopneumonia.  - Recommend follow-up CT chest in 3 months to confirm resolution     -Referred to pulmonology, visit on 11/30/2023 where the following plan was noted:   -Jennifer Huynh presents with a lung nodule for further evaluation and follow up.   -This nodule was not visualized 5 days earlier though it is not clear to me whether that CT goes up high enough to visualize it.   -I do think the CT A/P from June goes high enough and it does not appear to be there. In either case, the nodule develops very quickly.   -The differential would include bacterial infection, mucus impaction, atypical infection in an immunocompromised host (fungal) > autoimmune manifestation of crohns (or other dysregulated immune pulmonary condition (ANCA+, SSA+) >> malignancy. The antibiotic course for her anastomotic leak should have covered bacterial pneumonia and I would not repeat it without more evidence of bacterial infection given her C. Diff.     -We discussed interval imaging at 3 months from the original CT A/P - which will  be mid November. If it has not definitively resolved (or isn't clearly resolving) and non-invasive testing does not provide an answer, she probably warrants bronchoscopy given her immunosuppression. She should notify us  sooner if she develops symptoms.    - fungal antibodies today, fungitell, galactomannan (noting recent beta-lactam   exposure may produce low-level false-positive)  - Jennifer Huynh induced sputum (cannot do in clinic, will schedule for 10/17)  - CT Chest ~11/17, can discontinue 01/2024 CT Chest      -Fungal testing was NEGATIVE   -Repeat CT scheduled for 02/10/2024. Pulmonology follow up on 05/23/2024       Crohn's disease of small intestine with intestinal obstruction (CMS-HCC)  C. difficile diarrhea  Eosinophilic esophagitis  Intestinal anastomotic leak  -dx in 2018, previously was on Infliximab . Underwent ileocolic resection in 09/2023    -She is following closely with GI  -Currently no longer taking budesonide  but is receiving Skyrizi  injections.   -At this time, she notes she does not have any GI/abdominal complaints--symptoms appear to be controlled.     -She does  not appear to be taking PPI anymore however?   -To continue to follow closely with GI   -If reflux returns, could restart Pantoprazole  and/or take OTC Gaviscon as recommended by GI         Polyarthralgia  -Referred to rheumatology given she was having polyarthralgia after C. 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 It is unclear whether her significant arthralgia was due to a post-infectious arthritis, IBD arthritis flare in the setting of being off infliximab , or arthralgia from significant iron  deficiency. If she has recurrent joint swelling, stiffness, or significant pain, she can return and we can discuss additional therapy.  - continue Skyrizi  with GI  - return PRN if recurrence     Myalgia, Fatigue, Iron  deficiency: Agree with IV iron  infusions.      Positive ANA, Positive PR3 antibody  We discussed that she does not have any features of TNF-induced lupus or SLE at this point. Her positive ANA is likely due to prior long-term infliximab  use or IBD itself and may not portend any increased risk for SLE. Her low positive PR3 antibody is attributable to IBD itself and does not portend a risk for GPA. Her isolated episode of thrombocytopenia or ITP in 2024 is likely unrelated to these serologies.    -Further work up is not indicated at this time       Hair loss  Fatigue, unspecified type  -she notes more hair loss and still experiencing fatigue.   -Unsure if secondary to chronic conditions, vitamin or iron  deficiency and/or mood.   -Labs below  Orders:    TSH    T4, Free    T3, Free    Vitamin D  25 Hydroxy (25OH D2 + D3)    Vitamin B12 Level    Comprehensive Metabolic Panel    Magnesium Level    Palpitations  -she does appear to have palpitations chronically  -Zio patch was placed last visit in 10/2023 but was misplaced.   -Echocardiogram was ordered but not obtained   -At this time she has deferred further evaluation including referral to cardiology   -Will replace order to have zio patch replaced via NURSE VISIT if she wishes in the future  Orders:    External ECG-8 days to 15 days (ZIO); Future    Vitreous floaters of both eyes  -symptoms have improved but still occur   -She was provided number to ophthalmology and encouraged to schedule visit for evaluation          HEALTH MAINTENANCE  -pap smear due in 08/2025  -deferred Men B at this time   -HIV negative, Hepatitis C negative (04/18/22)   -She should still undergo annual skin evaluations     Return in about 4 months (around 05/24/2024).    Chief Complaint:   Chief Complaint   Patient presents with    Follow-up     Declined vaccine, no issues or concerns         History of Present Illness  Jennifer Huynh is a 22 year old female with Crohn's disease who presents for follow-up on pulmonary and gastrointestinal symptoms.    She has a pulmonary nodule under surveillance, with a repeat CT scan scheduled at the end of the  month to monitor a small area in her lung.    She recently experienced a cold with a productive cough, which has improved over the past week. No fever or hemoptysis is present. She is not currently taking budesonide , which was prescribed post-surgery for Crohn's-related symptoms, including C. diff and diarrhea. She is now on Skyrizi , having completed her third loading dose, with the next dose scheduled soon.    She has seen a rheumatologist who told her she did not think it was lupus at this time. She reports significant hair loss, described as 'falling out in handfuls' during showers and brushing. She experiences morning stiffness in her shoulders but reports improvement in joint pain previously affecting her hips, ankles, and knees.    She experiences palpitations and describes her heart rate as consistently fast, with a recent pulse of 99 bpm. She is considering replacing her heart monitor but is concerned about skin irritation from previous use.    She mentions persistent fatigue and low energy levels, despite sleeping adequately. She often feels she could 'sleep all day long' if possible. Her sleep schedule is affected by responsibilities such as taking her younger sister to school. She is interested in speaking with a therapist to address these issues.    She has not yet seen an eye doctor for floaters, which have decreased in frequency. She attempted a sputum test during a pulmonary function test but was unable to produce a sample. Blood work did not indicate a fungal infection.      Allergies:   Allergies as of 01/24/2024 - Reviewed 01/24/2024   Allergen Reaction Noted    Pistachio nut Shortness Of Breath 12/11/2013       Problem List: Problem List[1]    The following information was reviewed by members of the visit team:  Allergies - Medications - Medical History - Surgical History -          Vitals:    01/24/24 1058   BP: 133/86   Pulse: 99   Temp: 36.6 ??C (97.8 ??F)   SpO2: 98%   Weight: (!) 103.9 kg (229 lb)   Height: 170.2 cm (5' 7)     Body mass index is 35.87 kg/m??.    Wt Readings from Last 3 Encounters:   01/24/24 (!) 103.9 kg (229 lb)   12/28/23 (!) 102.5 kg (226 lb)   12/09/23 (!) 102.6 kg (226 lb 3.2 oz)       ROS: ROS negative unless otherwise noted in HPI.    EXAM:   Physical Exam  Constitutional:       General: She is not in acute distress.     Appearance: She is not ill-appearing, toxic-appearing or diaphoretic.   HENT:      Head: Normocephalic and atraumatic.   Eyes:      General:         Right eye: No discharge.         Left eye: No discharge.      Extraocular Movements: Extraocular movements intact.   Pulmonary:      Effort: Pulmonary effort is normal. No respiratory distress.   Musculoskeletal:         General: Normal range of motion.   Neurological:      General: No focal deficit present.      Mental Status: She is alert and oriented to person, place, and time.      Cranial Nerves: No cranial nerve deficit.   Psychiatric:  Mood and Affect: Mood normal.         Behavior: Behavior normal.         Thought Content: Thought content normal.         Judgment: Judgment normal.                  [1]   Patient Active Problem List  Diagnosis    Crohn's disease of both small and large intestine without complication (CMS-HCC)    Crohn's disease of both small and large intestine with rectal bleeding (CMS-HCC)    Acute idiopathic thrombocytopenic purpura    (CMS-HCC)    Severe anxiety    Moderate major depression (CMS-HCC)    Iron  deficiency anemia due to chronic blood loss    Iron  deficiency    Vitamin D  deficiency    Nephrolithiasis    Eosinophilic esophagitis    History of ITP    Crohn's disease    (CMS-HCC)    Hypercoagulable state (HHS-HCC)    Right lower quadrant abdominal pain    Intestinal anastomotic leak    C. difficile diarrhea    Lung nodule

## 2024-01-22 NOTE — Assessment & Plan Note (Addendum)
-  She does note persistent fatigue and low energy   -This could be due to her significant PMH but depression and stress could be contributing as well   -E-consult placed to psychiatry on 05/27/2023 regarding potential medications given PMH    -At this time, she would like to avoid medications but is interested in CBT  -Referral placed to VIRTUAL LCSW given her school schedule  Orders:    Ambulatory referral to Social Work; Future

## 2024-01-24 ENCOUNTER — Encounter
Admit: 2024-01-24 | Discharge: 2024-01-24 | Payer: Medicaid (Managed Care) | Attending: Student in an Organized Health Care Education/Training Program | Primary: Student in an Organized Health Care Education/Training Program

## 2024-01-24 DIAGNOSIS — D693 Immune thrombocytopenic purpura: Principal | ICD-10-CM

## 2024-01-24 DIAGNOSIS — R5383 Other fatigue: Principal | ICD-10-CM

## 2024-01-24 DIAGNOSIS — R911 Solitary pulmonary nodule: Principal | ICD-10-CM

## 2024-01-24 DIAGNOSIS — R002 Palpitations: Principal | ICD-10-CM

## 2024-01-24 DIAGNOSIS — M2550 Pain in unspecified joint: Principal | ICD-10-CM

## 2024-01-24 DIAGNOSIS — K50012 Crohn's disease of small intestine with intestinal obstruction: Principal | ICD-10-CM

## 2024-01-24 DIAGNOSIS — F321 Major depressive disorder, single episode, moderate: Principal | ICD-10-CM

## 2024-01-24 DIAGNOSIS — D500 Iron deficiency anemia secondary to blood loss (chronic): Principal | ICD-10-CM

## 2024-01-24 DIAGNOSIS — K9189 Other postprocedural complications and disorders of digestive system: Principal | ICD-10-CM

## 2024-01-24 DIAGNOSIS — L659 Nonscarring hair loss, unspecified: Principal | ICD-10-CM

## 2024-01-24 DIAGNOSIS — K200 Eosinophilic esophagitis: Principal | ICD-10-CM

## 2024-01-24 DIAGNOSIS — A0472 Enterocolitis due to Clostridium difficile, not specified as recurrent: Principal | ICD-10-CM

## 2024-01-24 LAB — IRON & TIBC
IRON SATURATION: 8 % — ABNORMAL LOW (ref 20–55)
IRON: 28 ug/dL — ABNORMAL LOW (ref 50–170)
TOTAL IRON BINDING CAPACITY: 350 ug/dL (ref 250–425)

## 2024-01-24 LAB — CBC
HEMATOCRIT: 34.1 % (ref 34.0–44.0)
HEMOGLOBIN: 11.5 g/dL (ref 11.3–14.9)
MEAN CORPUSCULAR HEMOGLOBIN CONC: 33.7 g/dL (ref 32.0–36.0)
MEAN CORPUSCULAR HEMOGLOBIN: 25.7 pg — ABNORMAL LOW (ref 25.9–32.4)
MEAN CORPUSCULAR VOLUME: 76.2 fL — ABNORMAL LOW (ref 77.6–95.7)
MEAN PLATELET VOLUME: 7.5 fL (ref 6.8–10.7)
PLATELET COUNT: 420 10*9/L (ref 150–450)
RED BLOOD CELL COUNT: 4.47 10*12/L (ref 3.95–5.13)
RED CELL DISTRIBUTION WIDTH: 15 % (ref 12.2–15.2)
WBC ADJUSTED: 7.1 10*9/L (ref 3.6–11.2)

## 2024-01-24 LAB — COMPREHENSIVE METABOLIC PANEL
ALBUMIN: 3.7 g/dL (ref 3.4–5.0)
ALKALINE PHOSPHATASE: 76 U/L (ref 46–116)
ALT (SGPT): 12 U/L (ref 10–49)
ANION GAP: 11 mmol/L (ref 5–14)
AST (SGOT): 14 U/L (ref <=34)
BILIRUBIN TOTAL: 0.2 mg/dL — ABNORMAL LOW (ref 0.3–1.2)
BLOOD UREA NITROGEN: 10 mg/dL (ref 9–23)
BUN / CREAT RATIO: 15
CALCIUM: 9.2 mg/dL (ref 8.7–10.4)
CHLORIDE: 103 mmol/L (ref 98–107)
CO2: 27 mmol/L (ref 20.0–31.0)
CREATININE: 0.68 mg/dL (ref 0.55–1.02)
EGFR CKD-EPI (2021) FEMALE: >90 mL/min/1.73m2 (ref >=60)
GLUCOSE RANDOM: 103 mg/dL — ABNORMAL HIGH (ref 70–99)
POTASSIUM: 4 mmol/L (ref 3.4–4.8)
PROTEIN TOTAL: 8.1 g/dL (ref 5.7–8.2)
SODIUM: 141 mmol/L (ref 135–145)

## 2024-01-24 LAB — VITAMIN B12: VITAMIN B-12: 527 pg/mL (ref 211–911)

## 2024-01-24 LAB — FERRITIN: FERRITIN: 3.6 ng/mL — ABNORMAL LOW (ref 30.0–270.7)

## 2024-01-24 LAB — TSH: THYROID STIMULATING HORMONE: 1.961 u[IU]/mL (ref 0.550–4.780)

## 2024-01-24 LAB — MAGNESIUM: MAGNESIUM: 1.8 mg/dL (ref 1.6–2.6)

## 2024-01-24 LAB — T3, FREE: T3 FREE: 3.22 pg/mL (ref 2.30–4.20)

## 2024-01-24 LAB — T4, FREE: FREE T4: 1.53 ng/dL (ref 0.89–1.76)

## 2024-01-24 NOTE — Patient Instructions (Signed)
[facility] [address] [place]  [phone]

## 2024-01-25 LAB — VITAMIN D 25 HYDROXY: VITAMIN D, TOTAL (25OH): 15 ng/mL — ABNORMAL LOW (ref 20.0–80.0)

## 2024-01-27 DIAGNOSIS — E559 Vitamin D deficiency, unspecified: Principal | ICD-10-CM

## 2024-01-27 MED ORDER — CHOLECALCIFEROL (VITAMIN D3) 25 MCG (1,000 UNIT) TABLET
ORAL_TABLET | Freq: Every day | ORAL | 1 refills | 90.00000 days | Status: CP
Start: 2024-01-27 — End: 2024-01-27

## 2024-02-10 ENCOUNTER — Inpatient Hospital Stay: Admit: 2024-02-10 | Discharge: 2024-02-10 | Payer: Medicaid (Managed Care)

## 2024-02-10 NOTE — Telephone Encounter (Signed)
 Called to discuss scan. Nodule resolved. No further follow up needed unless change in imaging or symptoms.    Prentice Maffucci, MD, MBA  Pulmonary and Critical Care Fellow   Pager: [phone]

## 2024-02-20 DIAGNOSIS — D693 Immune thrombocytopenic purpura: Principal | ICD-10-CM

## 2024-02-24 DIAGNOSIS — R002 Palpitations: Principal | ICD-10-CM

## 2024-02-24 NOTE — Progress Notes (Signed)
 Zio Patch monitor explained; instructions given, including to call [employer] for any difficulties; questions answered; skin prepped; monitor placed; pt initiated; registered with [employer]. Pt advised to call [employer] for problems @ [phone].  Pt verbalized understanding and does not have any further questions at this time.  Leighton Dage, CMA

## 2024-03-14 NOTE — Progress Notes (Signed)
 [facility] Clinical Assessment & Refill Coordination Note    Patient reports to be doing well on the medication and did not have any questions or concerns at this time    Jennifer Huynh, DOB: [birth year]  Phone: There are no phone numbers on file.    All above HIPAA information was verified with patient.     Was a nurse, learning disability used for this call? No    Specialty Medication(s):   Inflammatory Disorders: Skyrizi      Current Medications[1]     Changes to medications: Sila reports no changes at this time.    Medication list has been reviewed and updated in Epic: Yes    Allergies[2]    Changes to allergies: No    Allergies have been reviewed and updated in Epic: Yes    SPECIALTY MEDICATION ADHERENCE     Skyrizi   360 mg/2.64mL: 0 doses of medicine on hand   Specialty medication is an injection or given on a cycle: Yes, Next injection is scheduled for 1/28..    Medication Adherence    Patient reported X missed doses in the last month: 0  Specialty Medication: Skyrizi   Patient is on additional specialty medications: No  Informant: patient  Support network for adherence: family member          Specialty medication(s) dose(s) confirmed: Regimen is correct and unchanged.     Are there any concerns with adherence? No    Adherence counseling provided? Not needed    CLINICAL MANAGEMENT AND INTERVENTION      Clinical Benefit Assessment:    Do you feel the medicine is effective or helping your condition? Yes    Clinical Benefit counseling provided? Not needed    Adverse Effects Assessment:    Are you experiencing any side effects? No    Are you experiencing difficulty administering your medicine? No    Quality of Life Assessment:    Quality of Life    Rheumatology  Oncology  Dermatology  Cystic Fibrosis          How many days over the past month did your Crohn's  keep you from your normal activities? For example, brushing your teeth or getting up in the morning. 0    Have you discussed this with your provider? Not needed    Acute Infection Status:    Acute infections noted within Epic:  No active infections    Patient reported infection: None    Therapy Appropriateness:    Is the medication and dose appropriate considering the patient???s diagnosis, treatment, and disease journey, comorbidities, medical history, current medications, allergies, therapeutic goals, self-administration ability, and access barriers? Yes, therapy is appropriate and should be continued     Clinical Intervention:    Was an intervention completed as part of this clinical assessment? No    DISEASE/MEDICATION-SPECIFIC INFORMATION      N/A    Chronic Inflammatory Diseases: Have you experienced any flares in the last month? No  Has this been reported to your provider? Not applicable    PATIENT SPECIFIC NEEDS     Does the patient have any physical, cognitive, or cultural barriers? No    Is the patient high risk? No    Does the patient require physician intervention or other additional services (i.e., nutrition, smoking cessation, social work)? No    Does the patient have an additional or emergency contact listed in their chart? Yes    SOCIAL DETERMINANTS OF HEALTH  At the [facility], we have learned that life circumstances - like trouble affording food, housing, utilities, or transportation can affect the health of many of our patients.   That is why we wanted to ask: are you currently experiencing any life circumstances that are negatively impacting your health and/or quality of life? No    Social Drivers of Health     Food Insecurity: No Food Insecurity (05/26/2023)    Hunger Vital Sign     Worried About Running Out of Food in the Last Year: Never true     Ran Out of Food in the Last Year: Never true   Tobacco Use: Medium Risk (01/25/2024)    Patient History     Smoking Tobacco Use: Former     Smokeless Tobacco Use: Never     Passive Exposure: Past   Transportation Needs: No Transportation Needs (05/26/2023)    PRAPARE - Contractor (Medical): No     Lack of Transportation (Non-Medical): No   Alcohol Use: Not At Risk (04/19/2023)    Alcohol Use     How often do you have a drink containing alcohol?: Never     How many drinks containing alcohol do you have on a typical day when you are drinking?: Not on file     How often do you have 5 or more drinks on one occasion?: Never   Housing: Low Risk (05/26/2023)    Housing     Within the past 12 months, have you ever stayed: outside, in a car, in a tent, in an overnight shelter, or temporarily in someone else's home (i.e. couch-surfing)?: No     Are you worried about losing your housing?: No   Physical Activity: Not on file   Utilities: Low Risk (05/26/2023)    Utilities     Within the past 12 months, have you been unable to get utilities (heat, electricity) when it was really needed?: No   Stress: Not on file   Interpersonal Safety: Not At Risk (12/09/2023)    Interpersonal Safety     Unsafe Where You Currently Live: No     Physically Hurt by Anyone: No     Abused by Anyone: No   Substance Use: Not on file (12/28/2022)   Intimate Partner Violence: Not At Risk (12/09/2023)    Humiliation, Afraid, Rape, and Kick questionnaire     Fear of Current or Ex-Partner: No     Emotionally Abused: No     Physically Abused: No     Sexually Abused: No   Social Connections: Unknown (06/29/2022)    Received from [facility] Short Social Needs Screening - Social Connection     Would you like help with any of the following needs: food, medicine/medical supplies, transportation, loneliness, housing or utilities?: Not on file   Financial Resource Strain: Low Risk (08/21/2023)    Overall Financial Resource Strain (CARDIA)     Difficulty of Paying Living Expenses: Not very hard   Health Literacy: Not on file   Internet Connectivity: Not on file       Would you be willing to receive help with any of the needs that you have identified today? Not applicable       SHIPPING     Specialty Medication(s) to be Shipped:   Inflammatory Disorders: Skyrizi     Other medication(s) to be shipped: No additional medications requested for fill at this time    Specialty Medications not needed at  this time: N/A     Changes to insurance: No    Cost and Payment: Patient has a copay of $4. They are aware and have authorized the pharmacy to charge the credit card on file.    Delivery Scheduled: Yes, Expected medication delivery date: 1/23.     Medication will be delivered via Next Day Courier to the confirmed prescription address in [facility].    The patient will receive a drug information handout for each medication shipped and additional FDA Medication Guides as required.  Verified that patient has previously received a Conservation Officer, Historic Buildings and a Surveyor, Mining.    The patient or caregiver noted above participated in the development of this care plan and knows that they can request review of or adjustments to the care plan at any time.      All of the patient's questions and concerns have been addressed.    [name], PharmD   [facility] Specialty Pharmacist       [1]   Current Outpatient Medications   Medication Sig Dispense Refill    acetaminophen  (TYLENOL ) 500 MG tablet Take 2 tablets (1,000 mg total) by mouth every six (6) hours as needed for pain. 30 tablet 0    cholecalciferol , vitamin D3 25 mcg, 1,000 units,, (CHOLECALCIFEROL -25 MCG, 1,000 UNIT,) 1,000 unit (25 mcg) tablet Take 2 tablets (50 mcg total) by mouth daily. 180 tablet 1    empty container Misc Use 1 each 3    empty container Misc Use as directed 1 each 3    risankizumab -rzaa (SKYRIZI ) 360 mg/2.4 mL (150 mg/mL) Injt Inject the contents of 1 cartridge (360mg ) under the skin every 8 weeks 2.4 mL 2     No current facility-administered medications for this visit.   [2]   Allergies  Allergen Reactions    Pistachio Nut Shortness Of Breath

## 2024-03-15 MED FILL — SKYRIZI 360 MG/2.4 ML (150 MG/ML) SUBCUTANEOUS WEARABLE INJECTOR: SUBCUTANEOUS | 56 days supply | Qty: 2.4 | Fill #1
